# Patient Record
Sex: Female | Born: 1945 | Race: White | Hispanic: Yes | Smoking: Never smoker
Health system: Southern US, Community
[De-identification: ages and names within clinical notes are randomized; demographics above are authoritative.]

## PROBLEM LIST (undated history)

## (undated) DIAGNOSIS — G4730 Sleep apnea, unspecified: Secondary | ICD-10-CM

## (undated) DIAGNOSIS — K589 Irritable bowel syndrome without diarrhea: Secondary | ICD-10-CM

## (undated) DIAGNOSIS — N189 Chronic kidney disease, unspecified: Secondary | ICD-10-CM

## (undated) DIAGNOSIS — I10 Essential (primary) hypertension: Secondary | ICD-10-CM

## (undated) DIAGNOSIS — F419 Anxiety disorder, unspecified: Secondary | ICD-10-CM

## (undated) DIAGNOSIS — M1990 Unspecified osteoarthritis, unspecified site: Secondary | ICD-10-CM

## (undated) DIAGNOSIS — K219 Gastro-esophageal reflux disease without esophagitis: Secondary | ICD-10-CM

## (undated) DIAGNOSIS — E785 Hyperlipidemia, unspecified: Secondary | ICD-10-CM

## (undated) DIAGNOSIS — D649 Anemia, unspecified: Secondary | ICD-10-CM

## (undated) DIAGNOSIS — H269 Unspecified cataract: Secondary | ICD-10-CM

## (undated) DIAGNOSIS — K5289 Other specified noninfective gastroenteritis and colitis: Secondary | ICD-10-CM

## (undated) DIAGNOSIS — F32A Depression, unspecified: Secondary | ICD-10-CM

## (undated) DIAGNOSIS — F329 Major depressive disorder, single episode, unspecified: Secondary | ICD-10-CM

## (undated) HISTORY — DX: Essential (primary) hypertension: I10

## (undated) HISTORY — DX: Irritable bowel syndrome, unspecified: K58.9

## (undated) HISTORY — DX: Gastro-esophageal reflux disease without esophagitis: K21.9

## (undated) HISTORY — DX: Chronic kidney disease, unspecified: N18.9

## (undated) HISTORY — DX: Sleep apnea, unspecified: G47.30

## (undated) HISTORY — DX: Anemia, unspecified: D64.9

## (undated) HISTORY — DX: Depression, unspecified: F32.A

## (undated) HISTORY — DX: Unspecified cataract: H26.9

## (undated) HISTORY — PX: FACIAL COSMETIC SURGERY: SHX629

## (undated) HISTORY — DX: Other specified noninfective gastroenteritis and colitis: K52.89

## (undated) HISTORY — DX: Hyperlipidemia, unspecified: E78.5

## (undated) HISTORY — DX: Anxiety disorder, unspecified: F41.9

---
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
## 2000-08-27 DIAGNOSIS — N189 Chronic kidney disease, unspecified: Secondary | ICD-10-CM

## 2000-08-27 HISTORY — DX: Chronic kidney disease, unspecified: N18.9

## 2008-03-19 ENCOUNTER — Ambulatory Visit: Payer: Self-pay | Admitting: Internal Medicine

## 2008-03-19 DIAGNOSIS — E1165 Type 2 diabetes mellitus with hyperglycemia: Secondary | ICD-10-CM | POA: Insufficient documentation

## 2008-03-19 DIAGNOSIS — M129 Arthropathy, unspecified: Secondary | ICD-10-CM | POA: Insufficient documentation

## 2008-03-19 DIAGNOSIS — R198 Other specified symptoms and signs involving the digestive system and abdomen: Secondary | ICD-10-CM | POA: Insufficient documentation

## 2008-03-19 DIAGNOSIS — D649 Anemia, unspecified: Secondary | ICD-10-CM

## 2008-03-19 DIAGNOSIS — M810 Age-related osteoporosis without current pathological fracture: Secondary | ICD-10-CM | POA: Insufficient documentation

## 2008-03-19 DIAGNOSIS — K5900 Constipation, unspecified: Secondary | ICD-10-CM | POA: Insufficient documentation

## 2008-03-19 DIAGNOSIS — R109 Unspecified abdominal pain: Secondary | ICD-10-CM

## 2008-03-19 DIAGNOSIS — K5289 Other specified noninfective gastroenteritis and colitis: Secondary | ICD-10-CM

## 2008-03-19 HISTORY — DX: Other specified noninfective gastroenteritis and colitis: K52.89

## 2008-03-22 LAB — CONVERTED CEMR LAB
ALT: 30 units/L (ref 0–35)
Amylase: 171 units/L — ABNORMAL HIGH (ref 27–131)
BUN: 14 mg/dL (ref 6–23)
Basophils Relative: 0.6 % (ref 0.0–3.0)
CO2: 26 meq/L (ref 19–32)
Calcium: 8.7 mg/dL (ref 8.4–10.5)
Chloride: 103 meq/L (ref 96–112)
Creatinine, Ser: 1.4 mg/dL — ABNORMAL HIGH (ref 0.4–1.2)
GFR calc Af Amer: 49 mL/min
Glucose, Bld: 162 mg/dL — ABNORMAL HIGH (ref 70–99)
HCT: 31.3 % — ABNORMAL LOW (ref 36.0–46.0)
Hemoglobin: 10.6 g/dL — ABNORMAL LOW (ref 12.0–15.0)
Neutro Abs: 4 10*3/uL (ref 1.4–7.7)
Neutrophils Relative %: 57.3 % (ref 43.0–77.0)
RBC: 3.33 M/uL — ABNORMAL LOW (ref 3.87–5.11)
RDW: 12.9 % (ref 11.5–14.6)
Total Protein: 7.6 g/dL (ref 6.0–8.3)
WBC: 6.9 10*3/uL (ref 4.5–10.5)

## 2008-03-25 ENCOUNTER — Ambulatory Visit: Payer: Self-pay | Admitting: Internal Medicine

## 2008-03-25 LAB — CONVERTED CEMR LAB
Amylase: 134 units/L — ABNORMAL HIGH (ref 27–131)
Vitamin B-12: 124 pg/mL — ABNORMAL LOW (ref 211–911)

## 2008-03-31 ENCOUNTER — Ambulatory Visit: Payer: Self-pay | Admitting: Internal Medicine

## 2008-04-28 ENCOUNTER — Ambulatory Visit: Payer: Self-pay | Admitting: Internal Medicine

## 2008-05-05 ENCOUNTER — Ambulatory Visit: Payer: Self-pay | Admitting: Internal Medicine

## 2008-05-05 DIAGNOSIS — E538 Deficiency of other specified B group vitamins: Secondary | ICD-10-CM

## 2008-05-12 ENCOUNTER — Ambulatory Visit: Payer: Self-pay | Admitting: Internal Medicine

## 2008-06-25 ENCOUNTER — Ambulatory Visit (HOSPITAL_COMMUNITY): Admission: RE | Admit: 2008-06-25 | Discharge: 2008-06-25 | Payer: Self-pay | Admitting: Family Medicine

## 2009-11-15 ENCOUNTER — Observation Stay (HOSPITAL_COMMUNITY): Admission: EM | Admit: 2009-11-15 | Discharge: 2009-11-15 | Payer: Self-pay | Admitting: Emergency Medicine

## 2010-08-25 ENCOUNTER — Emergency Department (HOSPITAL_COMMUNITY)
Admission: EM | Admit: 2010-08-25 | Discharge: 2010-08-26 | Payer: Self-pay | Source: Home / Self Care | Admitting: Emergency Medicine

## 2010-11-06 LAB — URINE MICROSCOPIC-ADD ON

## 2010-11-06 LAB — POCT I-STAT, CHEM 8
Calcium, Ion: 1.18 mmol/L (ref 1.12–1.32)
Creatinine, Ser: 1.8 mg/dL — ABNORMAL HIGH (ref 0.4–1.2)
Hemoglobin: 10.9 g/dL — ABNORMAL LOW (ref 12.0–15.0)

## 2010-11-06 LAB — URINALYSIS, ROUTINE W REFLEX MICROSCOPIC: Glucose, UA: NEGATIVE mg/dL

## 2010-11-06 LAB — URINE CULTURE: Colony Count: >=100000

## 2010-11-06 LAB — GLUCOSE, CAPILLARY: Glucose-Capillary: 232 mg/dL — ABNORMAL HIGH (ref 70–99)

## 2010-11-20 LAB — URINALYSIS, ROUTINE W REFLEX MICROSCOPIC
Glucose, UA: 100 mg/dL — AB
Ketones, ur: NEGATIVE mg/dL
Nitrite: NEGATIVE

## 2010-11-20 LAB — GLUCOSE, CAPILLARY
Glucose-Capillary: 185 mg/dL — ABNORMAL HIGH (ref 70–99)
Glucose-Capillary: 237 mg/dL — ABNORMAL HIGH (ref 70–99)

## 2010-11-20 LAB — DIFFERENTIAL
Basophils Absolute: 0 10*3/uL (ref 0.0–0.1)
Eosinophils Absolute: 0.1 10*3/uL (ref 0.0–0.7)
Lymphocytes Relative: 45 % (ref 12–46)
Monocytes Absolute: 0.4 10*3/uL (ref 0.1–1.0)

## 2010-11-20 LAB — CBC
HCT: 32.8 % — ABNORMAL LOW (ref 36.0–46.0)
Hemoglobin: 11.3 g/dL — ABNORMAL LOW (ref 12.0–15.0)
MCV: 92.5 fL (ref 78.0–100.0)
WBC: 6.8 10*3/uL (ref 4.0–10.5)

## 2010-11-20 LAB — URINE MICROSCOPIC-ADD ON

## 2011-05-25 LAB — GLUCOSE, CAPILLARY
Glucose-Capillary: 144 — ABNORMAL HIGH
Glucose-Capillary: 149 — ABNORMAL HIGH

## 2011-08-28 DIAGNOSIS — E785 Hyperlipidemia, unspecified: Secondary | ICD-10-CM

## 2011-08-28 HISTORY — DX: Hyperlipidemia, unspecified: E78.5

## 2011-11-16 ENCOUNTER — Other Ambulatory Visit: Payer: Self-pay | Admitting: Obstetrics and Gynecology

## 2011-11-16 DIAGNOSIS — Z1231 Encounter for screening mammogram for malignant neoplasm of breast: Secondary | ICD-10-CM

## 2011-11-27 ENCOUNTER — Ambulatory Visit (HOSPITAL_COMMUNITY)
Admission: RE | Admit: 2011-11-27 | Discharge: 2011-11-27 | Disposition: A | Discharge status: Home / Self Care | Payer: Self-pay | Source: Ambulatory Visit | Attending: Obstetrics and Gynecology | Admitting: Obstetrics and Gynecology

## 2011-11-27 ENCOUNTER — Ambulatory Visit (INDEPENDENT_AMBULATORY_CARE_PROVIDER_SITE_OTHER): Payer: Self-pay | Admitting: *Deleted

## 2011-11-27 VITALS — BP 122/69 | HR 86 | Temp 97.4°F | Ht 60.25 in | Wt 126.2 lb

## 2011-11-27 DIAGNOSIS — Z1239 Encounter for other screening for malignant neoplasm of breast: Secondary | ICD-10-CM

## 2011-11-27 DIAGNOSIS — Z1231 Encounter for screening mammogram for malignant neoplasm of breast: Secondary | ICD-10-CM

## 2011-11-27 NOTE — Patient Instructions (Signed)
Taught patient how to perform BSE. Patient did not need a Pap smear today due to last Pap smear was 10/22/11. Let her know [facility] will cover Pap smears every 3 years unless has a history of abnormal Pap smears. Patient is escorted to mammography for a screening mammogram. Let patient know will follow up with her within the next couple weeks with results. Patient verbalized understanding.  

## 2011-11-27 NOTE — Progress Notes (Signed)
No complaints today.  Pap Smear:    Pap smear not performed today. Patients last Pap smear was 10/22/11 at the free Pap smear screening at the [facility] and awaiting result. Per patient she has no history of abnormal Pap smears. No Pap smear results in EPIC.  Physical exam: Breasts Breasts symmetrical. No skin abnormalities bilateral breasts. No nipple retraction bilateral breasts. No nipple discharge bilateral breasts. No lymphadenopathy. No lumps palpated bilateral breasts. No complaints of pain or tenderness on palpation.         Pelvic/Bimanual No Pap smear completed today since last Pap smear was 10/22/11. Pap smear not indicated per BCCCP guidelines.

## 2011-12-17 ENCOUNTER — Encounter: Payer: Self-pay | Admitting: Obstetrics and Gynecology

## 2013-09-04 ENCOUNTER — Ambulatory Visit: Payer: Self-pay | Attending: Internal Medicine

## 2013-09-21 ENCOUNTER — Ambulatory Visit: Payer: Self-pay

## 2013-10-09 ENCOUNTER — Encounter (HOSPITAL_BASED_OUTPATIENT_CLINIC_OR_DEPARTMENT_OTHER): Payer: Self-pay | Admitting: Emergency Medicine

## 2013-10-09 ENCOUNTER — Emergency Department (HOSPITAL_BASED_OUTPATIENT_CLINIC_OR_DEPARTMENT_OTHER)
Admission: EM | Admit: 2013-10-09 | Discharge: 2013-10-09 | Disposition: A | Discharge status: Home / Self Care | Payer: Medicaid Other | Attending: Emergency Medicine | Admitting: Emergency Medicine

## 2013-10-09 DIAGNOSIS — E119 Type 2 diabetes mellitus without complications: Secondary | ICD-10-CM | POA: Insufficient documentation

## 2013-10-09 DIAGNOSIS — N189 Chronic kidney disease, unspecified: Secondary | ICD-10-CM | POA: Insufficient documentation

## 2013-10-09 DIAGNOSIS — Z794 Long term (current) use of insulin: Secondary | ICD-10-CM | POA: Insufficient documentation

## 2013-10-09 DIAGNOSIS — L03319 Cellulitis of trunk, unspecified: Principal | ICD-10-CM

## 2013-10-09 DIAGNOSIS — L0390 Cellulitis, unspecified: Secondary | ICD-10-CM

## 2013-10-09 DIAGNOSIS — Z8719 Personal history of other diseases of the digestive system: Secondary | ICD-10-CM | POA: Insufficient documentation

## 2013-10-09 DIAGNOSIS — Z792 Long term (current) use of antibiotics: Secondary | ICD-10-CM | POA: Insufficient documentation

## 2013-10-09 DIAGNOSIS — Z79899 Other long term (current) drug therapy: Secondary | ICD-10-CM | POA: Insufficient documentation

## 2013-10-09 DIAGNOSIS — L0291 Cutaneous abscess, unspecified: Secondary | ICD-10-CM

## 2013-10-09 DIAGNOSIS — L02219 Cutaneous abscess of trunk, unspecified: Secondary | ICD-10-CM | POA: Insufficient documentation

## 2013-10-09 MED ORDER — CEPHALEXIN 500 MG PO CAPS: 500.0000 mg | ORAL_CAPSULE | Freq: Four times a day (QID) | ORAL | Status: DC

## 2013-10-09 NOTE — ED Notes (Signed)
I & D tray is at the bedside set up and ready for the doctor to use. 

## 2013-10-09 NOTE — Discharge Instructions (Signed)
Take Keflex as directed until gone. Refer to attached documents for more information.  °

## 2013-10-09 NOTE — ED Notes (Addendum)
Abscess to her right abdomen. Hx of diabetes. Family states her BS has been out of control for the past 2 weeks and her MD has been working on adjusting her Insulin.

## 2013-10-09 NOTE — ED Provider Notes (Signed)
CSN: [id number]     Arrival date & time 10/09/13  1458 History   First MD Initiated Contact with Patient 10/09/13 1553     Chief Complaint  Patient presents with  . Abscess     (Consider location/radiation/quality/duration/timing/severity/associated sxs/prior Treatment) Patient is a 68 y.o. female presenting with abscess. The history is provided by the patient. No language interpreter was used.  Abscess Location:  Torso Torso abscess location:  R flank Size:  3x3cm Abscess quality: fluctuance   Red streaking: no   Duration:  3 days Progression:  Worsening Chronicity:  New Context: diabetes   Context: not immunosuppression, not insect bite/sting and not skin injury   Relieved by:  Nothing Worsened by:  Nothing tried Ineffective treatments:  None tried Associated symptoms: no fatigue, no fever, no nausea and no vomiting   Risk factors: prior abscess   Risk factors: no family hx of MRSA and no hx of MRSA     Past Medical History  Diagnosis Date  . Diabetes mellitus   . IBS (irritable bowel syndrome)   . Chronic kidney disease   . Hyperlipidemia    History reviewed. No pertinent past surgical history. Family History  Problem Relation Age of Onset  . Diabetes Brother   . Diabetes Brother   . Diabetes Brother   . Diabetes Brother    History  Substance Use Topics  . Smoking status: Never Smoker   . Smokeless tobacco: Not on file  . Alcohol Use: No   OB History   Grav Para Term Preterm Abortions TAB SAB Ect Mult Living   7 7 7       7      Review of Systems  Constitutional: Negative for fever, chills and fatigue.  HENT: Negative for trouble swallowing.   Eyes: Negative for visual disturbance.  Respiratory: Negative for shortness of breath.   Cardiovascular: Negative for chest pain and palpitations.  Gastrointestinal: Negative for nausea, vomiting, abdominal pain and diarrhea.  Genitourinary: Negative for dysuria and difficulty urinating.  Musculoskeletal:  Negative for arthralgias and neck pain.  Skin: Positive for wound. Negative for color change.  Neurological: Negative for dizziness and weakness.  Psychiatric/Behavioral: Negative for dysphoric mood.      Allergies  Pollen extract-tree extract and Sulfonamide derivatives  Home Medications   Current Outpatient Rx  Name  Route  Sig  Dispense  Refill  . Insulin Lispro, Human, (HUMALOG [place])   Subcutaneous   Inject into the skin.         Marland Kitchen lisinopril (PRINIVIL,ZESTRIL) 10 MG tablet   Oral   Take 10 mg by mouth daily.         . nitrofurantoin (MACRODANTIN) 100 MG capsule   Oral   Take 100 mg by mouth daily.         . sertraline (ZOLOFT) 50 MG tablet   Oral   Take 50 mg by mouth daily.         . sitaGLIPtin (JANUVIA) 50 MG tablet   Oral   Take 50 mg by mouth daily.          BP 106/58  Pulse 95  Temp(Src) 98.2 F (36.8 C) (Oral)  Resp 20  Wt 126 lb (57.153 kg)  SpO2 97% Physical Exam  Nursing note and vitals reviewed. Constitutional: She is oriented to person, place, and time. She appears well-developed and well-nourished. No distress.  HENT:  Head: Normocephalic and atraumatic.  Eyes: Conjunctivae are normal.  Neck: Normal range of motion.  Cardiovascular: Normal rate and regular rhythm.  Exam reveals no gallop and no friction rub.   No murmur heard. Pulmonary/Chest: Effort normal and breath sounds normal. She has no wheezes. She has no rales. She exhibits no tenderness.  Abdominal: Soft. She exhibits no distension. There is no tenderness.  Musculoskeletal: Normal range of motion.  Neurological: She is alert and oriented to person, place, and time. Coordination normal.  Speech is goal-oriented. Moves limbs without ataxia.   Skin: Skin is warm and dry.  3x3cm area of induration of right flank with central fluctuance to palpation.   Psychiatric: She has a normal mood and affect. Her behavior is normal.    ED Course  Procedures (including critical care  time)  INCISION AND DRAINAGE Performed by: Alvina Chou Consent: Verbal consent obtained. Risks and benefits: risks, benefits and alternatives were discussed Type: abscess  Body area: right flank  Anesthesia: local infiltration  Incision was made with a scalpel.  Local anesthetic: lidocaine 2% without epinephrine  Anesthetic total: 1 ml  Complexity: complex Blunt dissection to break up loculations  Drainage: purulent  Drainage amount: 25mL  Patient tolerance: Patient tolerated the procedure well with no immediate complications.     Labs Review Labs Reviewed - No data to display Imaging Review No results found.  EKG Interpretation   None       MDM   Final diagnoses:  Abscess and cellulitis    4:15 PM Abscess drained without complication. Patient will be discharged with keflex. Vitals stable and patient afebrile. No further evaluation needed at this time.     Alvina Chou, PA-C 10/09/13 1635

## 2013-10-09 NOTE — ED Provider Notes (Signed)
Medical screening examination/treatment/procedure(s) were performed by non-physician practitioner and as supervising physician I was immediately available for consultation/collaboration.  Neta Ehlers, MD 10/09/13 [phone]

## 2013-11-24 DIAGNOSIS — E11319 Type 2 diabetes mellitus with unspecified diabetic retinopathy without macular edema: Secondary | ICD-10-CM | POA: Insufficient documentation

## 2014-01-22 ENCOUNTER — Other Ambulatory Visit: Payer: Self-pay | Admitting: Nephrology

## 2014-01-22 DIAGNOSIS — N183 Chronic kidney disease, stage 3 unspecified: Secondary | ICD-10-CM

## 2014-01-27 ENCOUNTER — Ambulatory Visit
Admission: RE | Admit: 2014-01-27 | Discharge: 2014-01-27 | Disposition: A | Discharge status: Home / Self Care | Payer: Medicaid Other | Source: Ambulatory Visit | Attending: Nephrology | Admitting: Nephrology

## 2014-01-27 DIAGNOSIS — N183 Chronic kidney disease, stage 3 unspecified: Secondary | ICD-10-CM

## 2014-06-28 ENCOUNTER — Encounter (HOSPITAL_BASED_OUTPATIENT_CLINIC_OR_DEPARTMENT_OTHER): Payer: Self-pay | Admitting: Emergency Medicine

## 2014-09-07 ENCOUNTER — Ambulatory Visit: Payer: Medicaid Other | Attending: Family Medicine | Admitting: Family Medicine

## 2014-09-07 ENCOUNTER — Encounter: Payer: Self-pay | Admitting: Family Medicine

## 2014-09-07 VITALS — BP 138/73 | HR 84 | Temp 97.9°F | Resp 16 | Ht 60.0 in | Wt 149.0 lb

## 2014-09-07 DIAGNOSIS — I10 Essential (primary) hypertension: Secondary | ICD-10-CM | POA: Diagnosis not present

## 2014-09-07 DIAGNOSIS — E079 Disorder of thyroid, unspecified: Secondary | ICD-10-CM

## 2014-09-07 DIAGNOSIS — IMO0002 Reserved for concepts with insufficient information to code with codable children: Secondary | ICD-10-CM

## 2014-09-07 DIAGNOSIS — E119 Type 2 diabetes mellitus without complications: Secondary | ICD-10-CM | POA: Diagnosis not present

## 2014-09-07 DIAGNOSIS — F32A Depression, unspecified: Secondary | ICD-10-CM | POA: Insufficient documentation

## 2014-09-07 DIAGNOSIS — D649 Anemia, unspecified: Secondary | ICD-10-CM

## 2014-09-07 DIAGNOSIS — E1165 Type 2 diabetes mellitus with hyperglycemia: Secondary | ICD-10-CM

## 2014-09-07 DIAGNOSIS — F329 Major depressive disorder, single episode, unspecified: Secondary | ICD-10-CM

## 2014-09-07 LAB — POCT GLYCOSYLATED HEMOGLOBIN (HGB A1C): Hemoglobin A1C: 10.4

## 2014-09-07 LAB — CBC
HEMATOCRIT: 32 % — AB (ref 36.0–46.0)
Hemoglobin: 10.8 g/dL — ABNORMAL LOW (ref 12.0–15.0)
MCH: 30.7 pg (ref 26.0–34.0)
MCHC: 33.8 g/dL (ref 30.0–36.0)
MCV: 90.9 fL (ref 78.0–100.0)
MPV: 10 fL (ref 8.6–12.4)
Platelets: 230 10*3/uL (ref 150–400)
RBC: 3.52 MIL/uL — ABNORMAL LOW (ref 3.87–5.11)
RDW: 14.5 % (ref 11.5–15.5)
WBC: 7.1 10*3/uL (ref 4.0–10.5)

## 2014-09-07 LAB — GLUCOSE, POCT (MANUAL RESULT ENTRY): POC Glucose: 273 mg/dl — AB (ref 70–99)

## 2014-09-07 MED ORDER — GABAPENTIN 300 MG PO CAPS: 300.0000 mg | ORAL_CAPSULE | Freq: Every day | ORAL | Status: DC

## 2014-09-07 MED ORDER — INSULIN ASPART 100 UNIT/ML FLEXPEN: 15.0000 [IU] | PEN_INJECTOR | Freq: Three times a day (TID) | SUBCUTANEOUS | Status: DC

## 2014-09-07 MED ORDER — INSULIN GLARGINE 100 UNIT/ML ~~LOC~~ SOLN: 50.0000 [IU] | Freq: Every day | SUBCUTANEOUS | Status: DC

## 2014-09-07 NOTE — Patient Instructions (Addendum)
Brenda Andrews,  Thank you for coming in today. It was a pleasure meeting you. I look forward to being your primary doctor.   1. Diabetes type 2: Increase lantus to 50 U at night Novolog 15 U three times daily  Continue regular meals. If sugar is low < 70 do not take novolog until it is over 100 Gabapentin for diabetic nerve pain Referral to ophthalmology   2. Thyroid issue: checking thyroid function   You will be called with lab results.   F/u in  3 weeks for diabetes f/u and depression  Dr. Adrian Blackwater

## 2014-09-07 NOTE — Progress Notes (Signed)
   Subjective:    Patient ID: Brenda Andrews, female    DOB: Oct 24, 1945, 69 y.o.   MRN: [id number] CC: establish care, chronic diabetes, HTN HPI 69 yo Hispanic female presents to establish care and discuss the following: History obtained via [place] interpreter   1. CHRONIC DIABETES  Disease Monitoring  Blood Sugar Ranges: 175-600   Polyuria: yes w/o dysuria   Visual problems: yes has hx of laser cataract surgery in both eyes about one year ago.   Medication Compliance: yes with lantus 30 U at night, novolog 30 U in AM and 20 U in PM and lisinopril 10 mg daily for prevention of diabetic nephropathy.  Medication Side Effects  Hypoglycemia: yes, reports sugars down to 40 and 50. She is symptomatic at this time and feels hungry.    Preventitive Health Care  Eye Exam: due   Foot Exam: done today   Diet pattern:  2. Thyroid issue: patient reports that she was told she had a problem with her thyroid last summer. She cannot recall what the problem was. There was no treatment done. She denies fever and chills. She admits to swelling with pink-purple skin discoloration in her hand in the morning.   Med Hx: DM dx in 1993 Surg Hx: catarct surgery in 2015 Soc Hx: non smoker  Review of Systems As per HPI     Objective:   Physical Exam BP 138/73 mmHg  Pulse 84  Temp(Src) 97.9 F (36.6 C) (Oral)  Resp 16  Ht 5' (1.524 m)  Wt 149 lb (67.586 kg)  BMI 29.10 kg/m2  SpO2 98% General appearance: alert, cooperative and no distress Neck: no adenopathy, no carotid bruit, no JVD, supple, symmetrical, trachea midline and thyroid not enlarged, symmetric, no tenderness/mass/nodules Lungs: clear to auscultation bilaterally Heart: regular rate and rhythm, S1, S2 normal, no murmur, click, rub or gallop Extremities:no edema, darkened finger nails, skin warm and dry Pulses" 2+ b/l radial and DP  Diabetic foot exam done today   Lab Results  Component Value Date   HGBA1C 10.40  09/07/2014        Assessment & Plan:

## 2014-09-07 NOTE — Progress Notes (Signed)
Pt is here to establish care. Pt reports having chronic pain in her upper back. Pt said that she is having a headache today. She also has a burning pain in both feet. Pt has a history of kidney disease, hyperlipidemia, HTN and diabetes. Pt states that when she wakes up every day her hands are discolored pink and purple. Also her nails are discolored black in some nails.

## 2014-09-07 NOTE — Assessment & Plan Note (Addendum)
Thyroid issue: checking thyroid function Normal thyroid studies Will resolve problem

## 2014-09-07 NOTE — Assessment & Plan Note (Addendum)
A: Diabetes type 2: uncontrolled with labile CBGs, hyperglycemia today and diabetic neuropathy P: Increase lantus to 50 U at night Novolog 15 U three times daily  Continue regular meals. If sugar is low < 70 do not take novolog until it is over 100 Gabapentin for diabetic nerve pain Referral to ophthalmology  Lipids, urine microalbumin, CMP, CBC today    Normal urine micro albumin Stable renal function.  High TGs and low HDL on non-fasting lipids.  Plan: restart lisinopril 10 mg daily with plan to increase dose over time as tolerated. Start lipitor 40 mg daily.  F/u fasting lipids in 5-6 weeks, patient to schedule blood draw.

## 2014-09-08 LAB — MICROALBUMIN / CREATININE URINE RATIO
CREATININE, URINE: 25.7 mg/dL
Microalb Creat Ratio: 27.2 mg/g (ref 0.0–30.0)
Microalb, Ur: 0.7 mg/dL (ref <2.0)

## 2014-09-08 LAB — COMPLETE METABOLIC PANEL WITH GFR
ALBUMIN: 4 g/dL (ref 3.5–5.2)
ALT: 16 U/L (ref 0–35)
AST: 15 U/L (ref 0–37)
Alkaline Phosphatase: 92 U/L (ref 39–117)
BUN: 34 mg/dL — AB (ref 6–23)
CHLORIDE: 102 meq/L (ref 96–112)
CO2: 26 mEq/L (ref 19–32)
Calcium: 8.6 mg/dL (ref 8.4–10.5)
Creat: 1.18 mg/dL — ABNORMAL HIGH (ref 0.50–1.10)
GFR, EST AFRICAN AMERICAN: 54 mL/min — AB
GFR, Est Non African American: 47 mL/min — ABNORMAL LOW
Glucose, Bld: 255 mg/dL — ABNORMAL HIGH (ref 70–99)
POTASSIUM: 5.5 meq/L — AB (ref 3.5–5.3)
Sodium: 133 mEq/L — ABNORMAL LOW (ref 135–145)
TOTAL PROTEIN: 7.3 g/dL (ref 6.0–8.3)
Total Bilirubin: 0.3 mg/dL (ref 0.2–1.2)

## 2014-09-08 LAB — LIPID PANEL
CHOLESTEROL: 182 mg/dL (ref 0–200)
HDL: 34 mg/dL — AB (ref >39)
TRIGLYCERIDES: 406 mg/dL — AB (ref <150)
Total CHOL/HDL Ratio: 5.4 Ratio

## 2014-09-08 LAB — T3: T3 TOTAL: 79.7 ng/dL — AB (ref 80.0–204.0)

## 2014-09-08 LAB — TSH: TSH: 4.043 u[IU]/mL (ref 0.350–4.500)

## 2014-09-08 LAB — T4, FREE: FREE T4: 0.89 ng/dL (ref 0.80–1.80)

## 2014-09-09 MED ORDER — ATORVASTATIN CALCIUM 40 MG PO TABS: 40.0000 mg | ORAL_TABLET | Freq: Every day | ORAL | Status: DC

## 2014-09-09 MED ORDER — LISINOPRIL 10 MG PO TABS: 10.0000 mg | ORAL_TABLET | Freq: Every day | ORAL | Status: DC

## 2014-09-09 NOTE — Addendum Note (Signed)
Addended by: Boykin Nearing on: 09/09/2014 06:27 PM   Modules accepted: Orders

## 2014-09-09 NOTE — Assessment & Plan Note (Signed)
A; persistent normocytic anemia P: FOBT Referral to GI for colonoscopy Anemia panel at next visit with trial of oral iron

## 2014-09-10 ENCOUNTER — Telehealth: Payer: Self-pay | Admitting: *Deleted

## 2014-09-10 NOTE — Telephone Encounter (Signed)
-----   Message from Minerva Ends, MD sent at 09/09/2014  6:25 PM EST ----- Normal thyroid studies Normal urine micro albumin Stable renal function.  High TGs and low HDL on non-fasting lipids.  Plan: restart lisinopril 10 mg daily with plan to increase dose over time as tolerated. Start lipitor 40 mg daily.  F/u fasting lipids in 5-6 weeks, patient to schedule blood draw.

## 2014-09-20 ENCOUNTER — Telehealth: Payer: Self-pay | Admitting: Family Medicine

## 2014-09-20 ENCOUNTER — Other Ambulatory Visit: Payer: Self-pay | Admitting: *Deleted

## 2014-09-20 DIAGNOSIS — IMO0002 Reserved for concepts with insufficient information to code with codable children: Secondary | ICD-10-CM

## 2014-09-20 DIAGNOSIS — E1165 Type 2 diabetes mellitus with hyperglycemia: Secondary | ICD-10-CM

## 2014-09-20 MED ORDER — LISINOPRIL 10 MG PO TABS: 10.0000 mg | ORAL_TABLET | Freq: Every day | ORAL | Status: DC

## 2014-09-20 MED ORDER — ATORVASTATIN CALCIUM 40 MG PO TABS: 40.0000 mg | ORAL_TABLET | Freq: Every day | ORAL | Status: DC

## 2014-09-20 NOTE — Telephone Encounter (Signed)
Patient called back to request blood work results.

## 2014-09-20 NOTE — Telephone Encounter (Signed)
Pt was given lab results. Ask for Rx to be change to [facility], Rx change and send  (informatio was given in [place])

## 2014-10-04 ENCOUNTER — Telehealth: Payer: Self-pay | Admitting: Family Medicine

## 2014-10-04 NOTE — Telephone Encounter (Signed)
Patients family member called stating that her mother is not feeling well and would like to see her PCP. Please f/u with pt.

## 2014-10-05 NOTE — Telephone Encounter (Signed)
Left voice message to return call 

## 2014-10-15 ENCOUNTER — Encounter: Payer: Self-pay | Admitting: Family Medicine

## 2014-10-15 ENCOUNTER — Ambulatory Visit: Payer: Medicaid Other | Attending: Family Medicine | Admitting: Family Medicine

## 2014-10-15 VITALS — BP 108/61 | HR 86 | Temp 98.1°F | Resp 16 | Ht 60.0 in | Wt 148.0 lb

## 2014-10-15 DIAGNOSIS — M79641 Pain in right hand: Secondary | ICD-10-CM | POA: Insufficient documentation

## 2014-10-15 DIAGNOSIS — E559 Vitamin D deficiency, unspecified: Secondary | ICD-10-CM

## 2014-10-15 DIAGNOSIS — R200 Anesthesia of skin: Secondary | ICD-10-CM | POA: Diagnosis not present

## 2014-10-15 DIAGNOSIS — E119 Type 2 diabetes mellitus without complications: Secondary | ICD-10-CM

## 2014-10-15 DIAGNOSIS — R51 Headache: Secondary | ICD-10-CM | POA: Insufficient documentation

## 2014-10-15 DIAGNOSIS — Z23 Encounter for immunization: Secondary | ICD-10-CM | POA: Diagnosis not present

## 2014-10-15 DIAGNOSIS — E1165 Type 2 diabetes mellitus with hyperglycemia: Secondary | ICD-10-CM

## 2014-10-15 DIAGNOSIS — R208 Other disturbances of skin sensation: Secondary | ICD-10-CM

## 2014-10-15 DIAGNOSIS — E538 Deficiency of other specified B group vitamins: Secondary | ICD-10-CM

## 2014-10-15 DIAGNOSIS — R42 Dizziness and giddiness: Secondary | ICD-10-CM | POA: Insufficient documentation

## 2014-10-15 DIAGNOSIS — IMO0002 Reserved for concepts with insufficient information to code with codable children: Secondary | ICD-10-CM

## 2014-10-15 LAB — GLUCOSE, POCT (MANUAL RESULT ENTRY): POC GLUCOSE: 109 mg/dL — AB (ref 70–99)

## 2014-10-15 MED ORDER — LISINOPRIL 5 MG PO TABS: 5.0000 mg | ORAL_TABLET | Freq: Every day | ORAL | Status: DC

## 2014-10-15 MED ORDER — IBUPROFEN 600 MG PO TABS: 600.0000 mg | ORAL_TABLET | Freq: Three times a day (TID) | ORAL | Status: DC | PRN

## 2014-10-15 MED ORDER — GABAPENTIN 300 MG PO CAPS: 300.0000 mg | ORAL_CAPSULE | Freq: Every day | ORAL | Status: DC

## 2014-10-15 NOTE — Progress Notes (Signed)
Patient here to follow up on her DM2 Patient has not been checking her sugars at home because she complains of pain in fingers and toes Patient will take flu shot

## 2014-10-15 NOTE — Patient Instructions (Signed)
Senora Dominguez-Hernandez.  Thank you for coming in today  1. Dizziness and headaches when leaning forward: taking gabapentin during the day.  Start gabapentin during the evening 300-600 mg Cut lisinopril in half, 5 mg daily instead of 10 mg  Checking vit D level  2. Finger numbness: Checking vitamin B12 level Continue to monitor blood sugars  3. R hand pain at 2nd and 5th fingers Ibuprofen 600 mg three times daily with food for 5 days, then 1-2 times daily as needed after that.  Go for R hand x-ray   Diabetes  Check blood sugar day fasting (3 times per day) and before meals  Goal fasting 100  Goal after eating < 160 Beware of hypoglycemia (low blood sugar) which is blood sugar < 70 with or without symptoms  F/u in 4 weeks for hand pain  Dr. Adrian Blackwater

## 2014-10-15 NOTE — Assessment & Plan Note (Signed)
A: Dizziness and headaches when leaning forward: taking gabapentin during the day.  P: Start gabapentin during the evening 300-600 mg Cut lisinopril in half, 5 mg daily instead of 10 mg  Checking vit D level

## 2014-10-15 NOTE — Assessment & Plan Note (Signed)
A:  R hand pain at 2nd and 5th fingers, suspect tendonitis P: Ibuprofen 600 mg three times daily with food for 5 days, then 1-2 times daily as needed after that.  Go for R hand x-ray

## 2014-10-15 NOTE — Progress Notes (Signed)
   Subjective:    Patient ID: Brenda Andrews, female    DOB: 06/09/1946, 69 y.o.   MRN: [id number] CC: dizziness, headache, numbness in digits, R hand pain  HPI 69 yo Hispanic female with hx of DM2:  1. Dizziness and headaches: when patient bends over and stands up x 3 months. No falling. No ringing in ears. She is taking gabapentin during the day instead of at night. She is taking lisinopril 10 mg daily to prevent diabetic nephropathy.  2. Numbness: in finger and toes of both hands. Day and night. Taking gabapentin 300 mg during the day. Has hx of vit B12 deficiency.   3. R hand pain: 2st and 5th MTP, palmar side. Mostly in 2nd MTP.  Worse with finger g flexion. No swelling, redness or deformity. No home treatments tried.   Soc Hx: non smoker  Review of Systems As per HPI      Objective:   Physical Exam BP 108/61 mmHg  Pulse 86  Temp(Src) 98.1 F (36.7 C)  Resp 16  Ht 5' (1.524 m)  Wt 148 lb (67.132 kg)  BMI 28.90 kg/m2  SpO2 99% General appearance: alert, cooperative and no distress Head: Normocephalic, without obvious abnormality, atraumatic Eyes: conjunctivae/corneas clear. PERRL, EOM's intact.  Ears: normal TM's and external ear canals both ears Lungs: normal WOB Extremities: normal hands w/o deformity, limited flexion and mild TTP R 1st digit, 2 + radial pulses   Lab Results  Component Value Date   HGBA1C 10.40 09/07/2014   CBG 109      Assessment & Plan:

## 2014-10-15 NOTE — Assessment & Plan Note (Signed)
Finger numbness: Checking vitamin B12 level Continue to monitor blood sugars

## 2014-10-16 LAB — VITAMIN B12: Vitamin B-12: 658 pg/mL (ref 211–911)

## 2014-10-16 LAB — VITAMIN D 25 HYDROXY (VIT D DEFICIENCY, FRACTURES): Vit D, 25-Hydroxy: 18 ng/mL — ABNORMAL LOW (ref 30–100)

## 2014-10-17 DIAGNOSIS — E559 Vitamin D deficiency, unspecified: Secondary | ICD-10-CM | POA: Insufficient documentation

## 2014-10-17 MED ORDER — VITAMIN D (ERGOCALCIFEROL) 1.25 MG (50000 UNIT) PO CAPS: 50000.0000 [IU] | ORAL_CAPSULE | ORAL | Status: DC

## 2014-10-17 NOTE — Addendum Note (Signed)
Addended by: Boykin Nearing on: 10/17/2014 08:20 AM   Modules accepted: Orders

## 2014-10-17 NOTE — Assessment & Plan Note (Signed)
A: likely cause of dizziness P: Supplement with 50K U weekly x 8 weeks

## 2014-10-17 NOTE — Assessment & Plan Note (Signed)
A: Diabetes:CBG well controlled Med: complaint  P: Check blood sugar day fasting (3 times per day) and before meals  Goal fasting 100  Goal after eating < 160 Beware of hypoglycemia (low blood sugar) which is blood sugar < 70 with or without symptoms

## 2014-10-17 NOTE — Assessment & Plan Note (Signed)
Resolved. Normal B12

## 2014-11-03 ENCOUNTER — Telehealth: Payer: Self-pay | Admitting: Family Medicine

## 2014-11-03 ENCOUNTER — Ambulatory Visit (INDEPENDENT_AMBULATORY_CARE_PROVIDER_SITE_OTHER): Payer: Self-pay | Admitting: Family Medicine

## 2014-11-03 VITALS — BP 132/50 | HR 88 | Temp 97.2°F | Resp 20 | Ht 61.0 in | Wt 146.6 lb

## 2014-11-03 DIAGNOSIS — D649 Anemia, unspecified: Secondary | ICD-10-CM

## 2014-11-03 DIAGNOSIS — R110 Nausea: Secondary | ICD-10-CM

## 2014-11-03 DIAGNOSIS — R269 Unspecified abnormalities of gait and mobility: Secondary | ICD-10-CM

## 2014-11-03 DIAGNOSIS — R109 Unspecified abdominal pain: Secondary | ICD-10-CM

## 2014-11-03 DIAGNOSIS — R42 Dizziness and giddiness: Secondary | ICD-10-CM

## 2014-11-03 LAB — POCT URINALYSIS DIPSTICK
BILIRUBIN UA: NEGATIVE
Glucose, UA: 500
KETONES UA: NEGATIVE
Leukocytes, UA: NEGATIVE
Nitrite, UA: NEGATIVE
Protein, UA: NEGATIVE
RBC UA: NEGATIVE
SPEC GRAV UA: 1.01
Urobilinogen, UA: 0.2
pH, UA: 6.5

## 2014-11-03 LAB — POCT UA - MICROSCOPIC ONLY
Bacteria, U Microscopic: NEGATIVE
Casts, Ur, LPF, POC: NEGATIVE
Crystals, Ur, HPF, POC: NEGATIVE
Mucus, UA: NEGATIVE
WBC, UR, HPF, POC: NEGATIVE
Yeast, UA: NEGATIVE

## 2014-11-03 LAB — POCT CBC
Granulocyte percent: 51.3 %G (ref 37–80)
HCT, POC: 34.7 % — AB (ref 37.7–47.9)
Hemoglobin: 11.3 g/dL — AB (ref 12.2–16.2)
Lymph, poc: 3.3 (ref 0.6–3.4)
MCH: 30.1 pg (ref 27–31.2)
MCHC: 32.6 g/dL (ref 31.8–35.4)
MCV: 92.4 fL (ref 80–97)
MID (cbc): 0.5 (ref 0–0.9)
MPV: 7.5 fL (ref 0–99.8)
PLATELET COUNT, POC: 256 10*3/uL (ref 142–424)
POC GRANULOCYTE: 4 (ref 2–6.9)
POC LYMPH %: 42.6 % (ref 10–50)
POC MID %: 6.1 %M (ref 0–12)
RBC: 3.76 M/uL — AB (ref 4.04–5.48)
RDW, POC: 13.9 %
WBC: 7.7 10*3/uL (ref 4.6–10.2)

## 2014-11-03 LAB — GLUCOSE, POCT (MANUAL RESULT ENTRY): POC Glucose: 370 mg/dl — AB (ref 70–99)

## 2014-11-03 MED ORDER — DIAZEPAM 2 MG PO TABS: ORAL_TABLET | ORAL | Status: DC

## 2014-11-03 NOTE — Patient Instructions (Addendum)
Take the diazepam one pill every 8 hours only when needed for bad dizziness.  Make certain you take a diabetic medicine faithfully  Drink plenty of water. If you do not it tends to cause her to be more dizzy  We will schedule you for a brain scan. Someone will call you regarding when and where that will be.  If you get abruptly worse go to the emergency room  I am not certain why she has had some pain in her left flank and abdomen. If that gets worse she is to return.  Take iron 1 pill daily for 2 months  Follow-up with your regular doctor for your diabetes in the near future.

## 2014-11-03 NOTE — Progress Notes (Signed)
Subjective: 69 year old lady who is brought in by her son. She does not speak [place], but he does very well. She has been having dizziness for the past 3 days. She had onset of it when she got up 3 days ago. It is persisted, maybe gotten worse. She's been staggering, more to the left. She has had clear speech. She has some headache but nothing major. She has not had any major change in her vision, though she has diabetic retinopathy and vision problems. She has not had any chest pains. She has not been short of breath. She has felt like she is maybe a little incoordinated on the left. She has some nausea but no vomiting. She lives with some friends and has a roommate.  She is diabetic and has been getting better control of her sugars. Last night it was 119. Her last A1c was high back in January in reviewing her old chart, but apparently she's been having better control since then. She has vision problems as noted above. She's never had spells of dizziness like this before. She [place] 6 years.  She does have some pain in the left side of abdomen and left flank.  Objective: TMs normal. Eyes the right pupil is 2 mm, left 1. Red reflex bilaterally. EOMs intact. Throat clear. Neck supple without nodes. No carotid bruits. Chest is clear to auscultation. Heart regular without murmurs gallops or arrhythmias. Extremities are grossly normal. Finger to nose normal. Romberg is negative. When she walks across the room and back she does stagger a little bit to the left. Grip seems symmetrical. Mild left CVA tenderness   Assessment: Dizziness Diabetes Left flank pain/left abdominal pain  Plan: CBC, glucose  Results for orders placed or performed in visit on 11/03/14  POCT CBC  Result Value Ref Range   WBC 7.7 4.6 - 10.2 K/uL   Lymph, poc 3.3 0.6 - 3.4   POC LYMPH PERCENT 42.6 10 - 50 %L   MID (cbc) 0.5 0 - 0.9   POC MID % 6.1 0 - 12 %M   POC Granulocyte 4.0 2 - 6.9   Granulocyte percent 51.3 37 - 80 %G   RBC 3.76 (A) 4.04 - 5.48 M/uL   Hemoglobin 11.3 (A) 12.2 - 16.2 g/dL   HCT, POC 34.7 (A) 37.7 - 47.9 %   MCV 92.4 80 - 97 fL   MCH, POC 30.1 27 - 31.2 pg   MCHC 32.6 31.8 - 35.4 g/dL   RDW, POC 13.9 %   Platelet Count, POC 256 142 - 424 K/uL   MPV 7.5 0 - 99.8 fL  POCT glucose (manual entry)  Result Value Ref Range   POC Glucose 370 (A) 70 - 99 mg/dl  POCT UA - Microscopic Only  Result Value Ref Range   WBC, Ur, HPF, POC neg    RBC, urine, microscopic 0-1    Bacteria, U Microscopic neg    Mucus, UA neg    Epithelial cells, urine per micros 0-2    Crystals, Ur, HPF, POC neg    Casts, Ur, LPF, POC neg    Yeast, UA neg   POCT urinalysis dipstick  Result Value Ref Range   Color, UA lt yellow    Clarity, UA clear    Glucose, UA 500    Bilirubin, UA neg    Ketones, UA neg    Spec Grav, UA 1.010    Blood, UA neg    pH, UA 6.5    Protein,  UA neg    Urobilinogen, UA 0.2    Nitrite, UA neg    Leukocytes, UA Negative

## 2014-11-03 NOTE — Telephone Encounter (Signed)
Patients family member called to request an appointment with her PCP, patient stated that the patient has been feeling dizzy since yesterday, family member was offered to walk-in to see our NP but she refused stating that she will find another provider, please f/u with pt. If necessary.

## 2014-11-13 ENCOUNTER — Ambulatory Visit
Admission: RE | Admit: 2014-11-13 | Discharge: 2014-11-13 | Disposition: A | Discharge status: Home / Self Care | Payer: Medicaid Other | Source: Ambulatory Visit | Attending: Family Medicine | Admitting: Family Medicine

## 2014-11-13 DIAGNOSIS — R269 Unspecified abnormalities of gait and mobility: Secondary | ICD-10-CM

## 2014-11-13 DIAGNOSIS — R42 Dizziness and giddiness: Secondary | ICD-10-CM

## 2014-12-14 ENCOUNTER — Ambulatory Visit: Payer: Medicaid Other | Attending: Family Medicine | Admitting: Family Medicine

## 2014-12-14 ENCOUNTER — Encounter: Payer: Self-pay | Admitting: Family Medicine

## 2014-12-14 VITALS — BP 128/68 | HR 86 | Temp 98.5°F | Resp 16 | Ht 61.0 in | Wt 141.0 lb

## 2014-12-14 DIAGNOSIS — E559 Vitamin D deficiency, unspecified: Secondary | ICD-10-CM

## 2014-12-14 DIAGNOSIS — B351 Tinea unguium: Secondary | ICD-10-CM | POA: Insufficient documentation

## 2014-12-14 DIAGNOSIS — IMO0002 Reserved for concepts with insufficient information to code with codable children: Secondary | ICD-10-CM

## 2014-12-14 DIAGNOSIS — L089 Local infection of the skin and subcutaneous tissue, unspecified: Secondary | ICD-10-CM | POA: Diagnosis not present

## 2014-12-14 DIAGNOSIS — E1165 Type 2 diabetes mellitus with hyperglycemia: Secondary | ICD-10-CM

## 2014-12-14 DIAGNOSIS — R21 Rash and other nonspecific skin eruption: Secondary | ICD-10-CM | POA: Insufficient documentation

## 2014-12-14 LAB — POCT GLYCOSYLATED HEMOGLOBIN (HGB A1C): HEMOGLOBIN A1C: 10.3

## 2014-12-14 LAB — GLUCOSE, POCT (MANUAL RESULT ENTRY): POC GLUCOSE: 206 mg/dL — AB (ref 70–99)

## 2014-12-14 MED ORDER — CLINDAMYCIN HCL 300 MG PO CAPS: 300.0000 mg | ORAL_CAPSULE | Freq: Four times a day (QID) | ORAL | Status: DC

## 2014-12-14 MED ORDER — KETOCONAZOLE 2 % EX CREA: 1.0000 "application " | TOPICAL_CREAM | Freq: Every day | CUTANEOUS | Status: DC

## 2014-12-14 NOTE — Patient Instructions (Addendum)
Brenda Andrews,  Thank you for coming in today.  1. Vit D deficiency- vit D level checked today.  2. Fungus in R thumb nail- another 4 weeks of lamisil after you finish 10 days of clindamycin.  3. Skin rash and fever: CBC with diff and CMP today.  Clindamycin for suspected bacterial rash Follow this up with antifungal cream- ketoconazole once daily.  F/u in 1 week for skin rash   Dr. Adrian Blackwater

## 2014-12-14 NOTE — Progress Notes (Signed)
F/U DM  Complaining of rash under breast x2 years, pain and itchy  Stated Hx cyst

## 2014-12-14 NOTE — Assessment & Plan Note (Signed)
A:  Skin rash and subjective fever. No fever today. Rash appears both fungal and bacterial. Patient at risk due to DM2 P: Clindamycin for suspected bacterial rash Follow this up with antifungal cream- ketoconazole once daily.

## 2014-12-14 NOTE — Assessment & Plan Note (Signed)
A: completed 8 weeks course of vit D P: recheck level at f/u

## 2014-12-14 NOTE — Progress Notes (Signed)
   Subjective:    Patient ID: Brenda Andrews, female    DOB: [birth year], 69 y.o.   MRN: [id number] CC: skin rash x 2 years, DM2 f/u  HPI [place] interpreter present   1. Skin rash: x 2 years. Under both breast, flanks and gluteal area. Has hx of ID under L breast. Rash is itchy and painful. Pain worsened 3 days ago. Patient has subjective fever. No drainage. Mild swelling under L breast. Treating with rubbing alcohol. No antipyretics taken today.   2. R thumb nail fungus: s/p 8 weeks of lamisil with slight improvement in nail. No improvement in skin rash.  3. Vit D def: took oral vit D, 50,000 IU x 8 weeks.   Soc hx: non smoker  Review of Systems  Constitutional: Positive for fever. Negative for chills.       Fever 3 days ago   Skin: Positive for color change and rash.      Objective:   Physical Exam BP 128/68 mmHg  Pulse 86  Temp(Src) 98.5 F (36.9 C) (Oral)  Resp 16  Ht 5\' 1"  (1.549 m)  Wt 141 lb (63.957 kg)  BMI 26.66 kg/m2  SpO2 96% General appearance: alert, cooperative and no distress Back: symmetric, no curvature. ROM normal. No CVA tenderness. Lungs: clear to auscultation bilaterally Breasts: normal appearance, no masses or tenderness Heart: regular rate and rhythm, S1, S2 normal, no murmur, click, rub or gallop Skin: hyperpigmented macular rash under both breast laterally and b/l flanks. Also with hyperpigmented papule on gluteal areas b/l. Erythematous nodule under L breast.  Nails: hyperpigmented nail R thumb  Lab Results  Component Value Date   HGBA1C 10.40 09/07/2014   Lab Results  Component Value Date   HGBA1C 10.30 12/14/2014        Assessment & Plan:

## 2014-12-14 NOTE — Assessment & Plan Note (Signed)
Fungus in R thumb nail- another 4 weeks of lamisil after you finish 10 days of clindamycin.

## 2014-12-20 ENCOUNTER — Ambulatory Visit: Payer: Medicaid Other | Attending: Family Medicine | Admitting: Family Medicine

## 2014-12-20 ENCOUNTER — Encounter: Payer: Self-pay | Admitting: Family Medicine

## 2014-12-20 VITALS — BP 109/59 | HR 81 | Temp 97.9°F | Resp 16 | Ht 61.0 in | Wt 149.0 lb

## 2014-12-20 DIAGNOSIS — L089 Local infection of the skin and subcutaneous tissue, unspecified: Secondary | ICD-10-CM

## 2014-12-20 DIAGNOSIS — B351 Tinea unguium: Secondary | ICD-10-CM

## 2014-12-20 DIAGNOSIS — IMO0002 Reserved for concepts with insufficient information to code with codable children: Secondary | ICD-10-CM

## 2014-12-20 DIAGNOSIS — E1165 Type 2 diabetes mellitus with hyperglycemia: Secondary | ICD-10-CM

## 2014-12-20 LAB — GLUCOSE, POCT (MANUAL RESULT ENTRY): POC GLUCOSE: 124 mg/dL — AB (ref 70–99)

## 2014-12-20 MED ORDER — TERBINAFINE HCL 250 MG PO TABS: 250.0000 mg | ORAL_TABLET | Freq: Every day | ORAL | Status: DC

## 2014-12-20 MED ORDER — MUPIROCIN 2 % EX OINT: 1.0000 "application " | TOPICAL_OINTMENT | Freq: Two times a day (BID) | CUTANEOUS | Status: DC

## 2014-12-20 NOTE — Progress Notes (Signed)
F/U rash under breast  Stated doing better

## 2014-12-20 NOTE — Assessment & Plan Note (Signed)
Skin rash: Improving, finish clindamycin I suspect MRSA, Nasal swab today. bactroban ointment to nose twice daily for 5 days

## 2014-12-20 NOTE — Patient Instructions (Addendum)
[name],  Thank you for coming in today.  1. Skin rash: Improving, finish clindamycin I suspect MRSA, Nasal swab today. bactroban ointment to nose twice daily for 5 days  2. Fungus in R great thumb: Start lamisil 250 mg daily for 8 weeks once done with clindamycin.  Dr. Adrian Blackwater      MRSA, Pacientes ambulatorios (Community-Associated MRSA)  CA-MRSA significa estafilococo aureus resistente a la meticilina asociado a la comunidad. Se trata de un tipo de bacteria resistente a algunos antibiticos que puede causar infecciones en la piel y otras zonas. El staphylococcus aureus es una bacteria que vive normalmente en la piel o en la nariz. el estafilococo que se encuentra en la superficie de la piel o en la nariz no causa problemas. Sin embargo, si ingresa al organismo a travs de un corte o una herida, puede producir una infeccin. Hasta no hace mucho tiempo, las infecciones por estafilococo tipo MRSA ocurran en hospitales y otros establecimientos sanitarios. Ahora se observa que causa infecciones en toda la comunidad. El SMRA ocasiona problemas de salud graves que pueden llevar a la [place]. Pero se est volviendo un problema cada vez ms frecuente. Se sabe que se contagia en lugares en los que hay mucha gente, en crceles y prisiones, y en situaciones en las que se producen contactos piel a piel, como durante eventos deportivos o en vestuarios. Puede contagiarse a travs de utensilios compartidos, como rasuradotas, toallas o equipos deportivos.  CAUSAS [employer] estafilococos, incluyendo el SAMR normalmente son inocuos excepto que puedan entrar al torrente sanguneo a travs de un rasguo, un corte o una herida como la de [place]. [employer] estafilococos, incluyendo el SAMR pueden contagiarse de Ardelia Mems persona a otra tocando objetos contaminados as como a travs del Youth worker.  MRSA ahora causa la enfermedad en aquellas personas que no han estado en hospitales u otros  establecimientos sanitarios. Los casos de enfermedades ocasionadas por MRSA en la comunidad se han asociado al:  Uso reciente de antibiticos.  Compartir toallas o ropa contaminada.  Padecer enfermedades activas de la piel.  Participar en deportes de contacto.  Vivir en condiciones de superpoblacin.  Uso de medicamentos por va intravenosa.  Las infecciones por MRSA relacionadas con la comunidad son normalmente infecciones de la piel, pero pueden causar otros trastornos graves.  La bacteria estafilococo es una de las causas ms comunes de infecciones de la piel. Sin embargo, tambin son causa frecuente de neumona, infecciones en los [employer] o las articulaciones e infecciones en la [place]. DIAGNSTICO El diagnstico se realiza a travs de cultivos o pruebas especiales de [place] de fluidos provenientes de:  Hisopado tomado de los cortes o heridas en las zonas infectadas.  Hisopados nasales.  Saliva o mucus proveniente de los pulmones (esputo).  Orina.  Sangre. Algunas personas son portadores, pero no presentan signos de infeccin. Esto significa que llevan el germen en su piel o en su nariz pero no desarrollan la infeccin.  TRATAMIENTO El tratamiento vara y se basa en la gravedad, la profundidad y la extensin del proceso infeccioso. Por ejemplo:  Algunas infecciones de la piel, como pequeos granos o abscesos pueden tratarse drenando el pus del lugar de la infeccin.  Las infecciones ms profundas o ms diseminadas de los tejidos blandos generalmente se tratan con ciruga para drenar el pus y antibiticos por va intravenosa o por boca. [employer] se recomienda an en el caso de las mujeres [place].  Las infecciones ms graves requieren la hospitalizacin. En el  caso de Designer, industrial/product antibiticos, el tratamiento durar varias semanas. PREVENCIN Debido a que [employer] son portadoras, la prevencin del contagio de la bacteria de Ardelia Mems persona a otra es lo ms  importante. El mejor modo de prevenir la diseminacin de las bacterias y otros grmenes es a travs del correcto lavado de las manos o usando desinfectantes con base de alcohol. A continuacin se indican otras formas de evitar el contagio en los establecimientos comunitario.   Stacy Gardner y frote sus manos con agua tibia y jabn durante al menos 15 segundos. Tambin puede usar desinfectantes con base de alcohol cuando no se dispone de [place] y [place].  Asegrese de [employer] personas con las que convive se lavan las manos con frecuencia.  No comparta utensilios personales. Por ejemplo, evite compartir rasuradoras, toallas, ropa y equipos deportivos.  Lave y seque su ropa, y la ropa de cama, a las temperaturas ms calientes recomendadas en las etiquetas.  [place] heridas cubiertas. El pus de las llagas infectadas puede contener las bacterias. Mantenga los cortes y las raspaduras limpios y cubiertos con una venda hasta que curen.  Si tiene una herida que parece estar infectada, consulte con su mdico si debe realizar un cultivo.  Si est amamantando, converse con su mdico acerca de este problema. Podr pedirle que suspenda por un tiempo la Therapist, nutritional. INSTRUCCIONES PARA EL CUIDADO DOMICILIARIO  Utilice los medicamentos tal como se le indic. No saltee medicamentos ni abandone su uso prematuramente slo porque cree que est mejorando.  Los que presentan este tipo de infeccin deben English as a second language teacher contacto con los que los rodean. No use toallas, rasuradotas, cepillos de dientes o ropa de cama que usarn [employer].  Para luchar contra la infeccin, siga las indicaciones de su mdico para el cuidado de la herida. Lvese bien las manos antes y despus de cambiarse el vendaje.  Si tiene un dispositivo intravascular, como un catter, asegrese que sabe como cuidarlo.  Asegrese de informar a todos los profesionales que lo asistan que usted sufre MRSA, de modo que puedan tomar las medidas necesarias  relacionadas con su infeccin. SOLICITE ATENCIN MDICA DE INMEDIATO SI:  La infeccin parece empeorar y observa:  Aumento del calor, inflamacin o dolor alrededor de la herida.  Aparece una lnea roja que se extiende desde el lugar de la infeccin.  La zona de la infeccin se torna de un color oscuro.  La herida drena un lquido de color marrn, amarillo o verdoso.  Despide un olor ftido.  Comienza a sentir nuseas y vmitos o no puede [employer].  Tiene fiebre.  Su beb tiene ms de 3 meses y su temperatura rectal es de 102 F (38.9 C) o ms.  Su beb tiene 3 meses o menos y su temperatura rectal es de 100.4 F (38 C) o ms.  Presenta dificultades respiratorias. EST SEGURO QUE:   Comprende las instrucciones para el alta mdica.  Controlar su enfermedad.  Solicitar atencin mdica de inmediato segn las indicaciones. Document Released: 11/20/2007 Document Revised: 11/05/2011 [facility] Patient Information 2015 [place]. This information is not intended to replace advice given to you by your health care provider. Make sure you discuss any questions you have with your health care provider.

## 2014-12-20 NOTE — Progress Notes (Signed)
   Subjective:    Patient ID: Brenda Andrews, female    DOB: 02-27-1946, 69 y.o.   MRN: [id number] CC: f/u skin rash x 2 years  HPI  Spanish interpreter present   1. Skin rash: x 2 years. Under both breast, flanks and gluteal area. Has hx of ID under L breast. Rash is itchy and painful. Improving on clindamycin. Small amount of itching. Redness has improved. Did develop a new area in her L posterior scalp, raised, drained white pus, painful.   2. R thumb nail fungus: s/p 8 weeks of lamisil with slight improvement in nail.    Soc hx: non smoker  Review of Systems  Constitutional: Negative for fever and chills.  Skin: Positive for color change and rash.      Objective:   Physical Exam  BP 109/59 mmHg  Pulse 81  Temp(Src) 97.9 F (36.6 C) (Oral)  Resp 16  Ht 5\' 1"  (1.549 m)  Wt 149 lb (67.586 kg)  BMI 28.17 kg/m2  SpO2 98% General appearance: alert, cooperative and no distress Head: scaly and flaky lesion R posterior scalp about 5x5 mm Skin: hyperpigmented macular rash under both breast laterally and b/l flanks. Also with hyperpigmented papule on gluteal areas b/l. Rash has no erythema. No tenderness. Nails: hyperpigmented nail R thumb       Assessment & Plan:

## 2014-12-20 NOTE — Assessment & Plan Note (Signed)
Fungus in R great thumb: Start lamisil 250 mg daily for 8 weeks once done with clindamycin.

## 2014-12-23 LAB — NASAL CULTURE (N/P): Organism ID, Bacteria: NORMAL

## 2014-12-28 ENCOUNTER — Telehealth: Payer: Self-pay | Admitting: *Deleted

## 2014-12-28 ENCOUNTER — Other Ambulatory Visit: Payer: Self-pay | Admitting: Family Medicine

## 2014-12-28 NOTE — Telephone Encounter (Signed)
Aware of results. 

## 2014-12-28 NOTE — Telephone Encounter (Signed)
-----   Message from Boykin Nearing, MD sent at 12/23/2014  1:26 PM EDT ----- Normal nose culture. No staph

## 2014-12-28 NOTE — Telephone Encounter (Signed)
LVM to return call.

## 2015-03-10 ENCOUNTER — Encounter: Payer: Self-pay | Admitting: Family Medicine

## 2015-03-10 ENCOUNTER — Telehealth: Payer: Self-pay | Admitting: Family Medicine

## 2015-03-10 ENCOUNTER — Ambulatory Visit: Payer: Medicaid Other | Attending: Family Medicine | Admitting: Family Medicine

## 2015-03-10 VITALS — BP 125/68 | HR 90 | Temp 99.7°F | Resp 16 | Ht 61.0 in

## 2015-03-10 DIAGNOSIS — Z0000 Encounter for general adult medical examination without abnormal findings: Secondary | ICD-10-CM

## 2015-03-10 DIAGNOSIS — H547 Unspecified visual loss: Secondary | ICD-10-CM | POA: Diagnosis not present

## 2015-03-10 DIAGNOSIS — R21 Rash and other nonspecific skin eruption: Secondary | ICD-10-CM | POA: Diagnosis not present

## 2015-03-10 DIAGNOSIS — L089 Local infection of the skin and subcutaneous tissue, unspecified: Secondary | ICD-10-CM | POA: Diagnosis not present

## 2015-03-10 DIAGNOSIS — E119 Type 2 diabetes mellitus without complications: Secondary | ICD-10-CM | POA: Insufficient documentation

## 2015-03-10 DIAGNOSIS — E1165 Type 2 diabetes mellitus with hyperglycemia: Secondary | ICD-10-CM | POA: Diagnosis not present

## 2015-03-10 DIAGNOSIS — IMO0002 Reserved for concepts with insufficient information to code with codable children: Secondary | ICD-10-CM

## 2015-03-10 LAB — POCT URINALYSIS DIPSTICK
BILIRUBIN UA: NEGATIVE
Glucose, UA: 500
Ketones, UA: NEGATIVE
Leukocytes, UA: NEGATIVE
NITRITE UA: NEGATIVE
PH UA: 6
Protein, UA: 30
Spec Grav, UA: 1.01
Urobilinogen, UA: 0.2

## 2015-03-10 LAB — GLUCOSE, POCT (MANUAL RESULT ENTRY)
POC Glucose: 504 mg/dl — AB (ref 70–99)
POC Glucose: 464 mg/dl — AB (ref 70–99)

## 2015-03-10 LAB — POCT GLYCOSYLATED HEMOGLOBIN (HGB A1C): HEMOGLOBIN A1C: 13.7

## 2015-03-10 MED ORDER — TRIAMCINOLONE ACETONIDE 0.1 % EX CREA: 1.0000 "application " | TOPICAL_CREAM | Freq: Two times a day (BID) | CUTANEOUS | Status: DC

## 2015-03-10 MED ORDER — GLUCOSE BLOOD VI STRP: 1.0000 | ORAL_STRIP | Freq: Three times a day (TID) | Status: DC

## 2015-03-10 MED ORDER — INSULIN ASPART PROT & ASPART (70-30 MIX) 100 UNIT/ML ~~LOC~~ SUSP: 30.0000 [IU] | Freq: Two times a day (BID) | SUBCUTANEOUS | Status: DC

## 2015-03-10 MED ORDER — INSULIN ASPART 100 UNIT/ML ~~LOC~~ SOLN
20.0000 [IU] | Freq: Once | SUBCUTANEOUS | Status: AC
  Administered 2015-03-10: 20 [IU] via SUBCUTANEOUS

## 2015-03-10 MED ORDER — ACCU-CHEK SOFTCLIX LANCETS MISC: 1.0000 | Freq: Three times a day (TID) | Status: DC

## 2015-03-10 MED ORDER — ZOSTER VACCINE LIVE 19400 UNT/0.65ML ~~LOC~~ SOLR: 0.6500 mL | Freq: Once | SUBCUTANEOUS | Status: DC

## 2015-03-10 NOTE — Assessment & Plan Note (Signed)
Poor vision: opthalmology referral placed  F/u with RN in 3 weeks F/u with me in 6 weeks

## 2015-03-10 NOTE — Progress Notes (Signed)
F/U DM  Complaining of

## 2015-03-10 NOTE — Telephone Encounter (Signed)
Called patient [facility] Left VM, d/c novolog and lantus.

## 2015-03-10 NOTE — Assessment & Plan Note (Signed)
Diabetes: Uncontrolled A1c elevated and blood sugar elevated  Check and write down sugars 2-3 times a day. Fasting and before insulin doses.  Diabetes blood sugar goals  Fasting (in AM before breakfast, 8 hrs of no eating or drinking (except water or unsweetened coffee or tea): 90-110 2 hrs after meals: < 160,   No low sugars: nothing < 70   Stop lantus and novolog  Start 70/30, 30 Units twice daily Meter and test strips ordered

## 2015-03-10 NOTE — Assessment & Plan Note (Signed)
Skin rash: Cause unclear Kenalog cream twice daily Dermatology referral

## 2015-03-10 NOTE — Assessment & Plan Note (Signed)
Shingles vaccine given

## 2015-03-10 NOTE — Patient Instructions (Addendum)
Brenda Andrews  1. Diabetes: Uncontrolled A1c elevated and blood sugar elevated  Check and write down sugars 2-3 times a day. Fasting and before insulin doses.  Diabetes blood sugar goals  Fasting (in AM before breakfast, 8 hrs of no eating or drinking (except water or unsweetened coffee or tea): 90-110 2 hrs after meals: < 160,   No low sugars: nothing < 70   Stop lantus and novolog  Start 70/30, 30 Units twice daily Meter and test strips ordered   2. Skin rash: Cause unclear Kenalog cream twice daily Dermatology referral  3. Poor vision: opthalmology referral placed  F/u with RN in 3 weeks F/u with me in 6 weeks

## 2015-03-10 NOTE — Progress Notes (Signed)
   Subjective:    Patient ID: Brenda Andrews, female    DOB: [birth year], 69 y.o.   MRN: [id number] CC: DM2 follow up, skin rash Spanish interpreter present  HPI  1 CHRONIC DIABETES  Disease Monitoring  Blood Sugar Ranges: 130s in AM at home, has not checked in 8 days   Polyuria: yes   Visual problems: yes   Medication Compliance: no, taking 30 U of novolog in the AM and 30 U of lantus in the evening   Medication Side Effects  Hypoglycemia: no   Preventitive Health Care  Eye Exam: due   2. Skin rash: recurrent hyperpigmented rash with tenderness along flanks. Had fever and chills x 2 days. None today.    Soc Hx: non smoker  Review of Systems  Constitutional: Positive for fever and chills.  Eyes: Positive for visual disturbance.  Respiratory: Negative for shortness of breath.   Cardiovascular: Negative for chest pain, palpitations and leg swelling.  Gastrointestinal: Negative for nausea, vomiting and abdominal pain.  Skin: Positive for rash.      Objective:   Physical Exam BP 125/68 mmHg  Pulse 90  Temp(Src) 99.7 F (37.6 C) (Oral)  Resp 16  Ht 5\' 1"  (1.549 m)  SpO2 97%  Wt Readings from Last 3 Encounters:  12/20/14 149 lb (67.586 kg)  12/14/14 141 lb (63.957 kg)  11/03/14 146 lb 9.6 oz (66.497 kg)  General appearance: alert, cooperative and no distress Lungs: clear to auscultation bilaterally Heart: regular rate and rhythm, S1, S2 normal, no murmur, click, rub or gallop Abdomen: soft, non-tender; bowel sounds normal; no masses,  no organomegaly Extremities: extremities normal, atraumatic, no cyanosis or edema  Skin: hyperpigmented papules in a circular distrubution on b/l flanks, mild TTP. No erythema   Lab Results  Component Value Date   HGBA1C 13.70 03/10/2015   CBG 504  UA: 500 glucose, negative ketone   Treatment: 20 U novolog NS via IV  500 ml  CBG 464      Assessment & Plan:

## 2015-03-11 ENCOUNTER — Telehealth: Payer: Self-pay | Admitting: *Deleted

## 2015-03-11 LAB — BASIC METABOLIC PANEL
BUN: 26 mg/dL — AB (ref 6–23)
CO2: 21 meq/L (ref 19–32)
Calcium: 8.5 mg/dL (ref 8.4–10.5)
Chloride: 95 mEq/L — ABNORMAL LOW (ref 96–112)
Creat: 1.98 mg/dL — ABNORMAL HIGH (ref 0.50–1.10)
GLUCOSE: 496 mg/dL — AB (ref 70–99)
POTASSIUM: 5.2 meq/L (ref 3.5–5.3)
Sodium: 126 mEq/L — ABNORMAL LOW (ref 135–145)

## 2015-03-11 NOTE — Telephone Encounter (Signed)
-----   Message from Boykin Nearing, MD sent at 03/11/2015  8:49 AM EDT ----- High sugar of 496. Low sodium but corrects to 132-136 which is low/normal.  Patient must drink plenty of water Monitor sugars Call with update next week so adjustments can be made if too high or too low

## 2015-03-18 ENCOUNTER — Encounter: Payer: Self-pay | Admitting: Internal Medicine

## 2015-03-24 ENCOUNTER — Ambulatory Visit: Payer: Medicaid Other | Attending: Family Medicine | Admitting: Pharmacist

## 2015-03-24 DIAGNOSIS — Z0000 Encounter for general adult medical examination without abnormal findings: Secondary | ICD-10-CM | POA: Diagnosis not present

## 2015-03-24 MED ORDER — ZOSTER VACCINE LIVE 19400 UNT/0.65ML ~~LOC~~ SOLR
0.6500 mL | Freq: Once | SUBCUTANEOUS | Status: AC
  Administered 2015-03-24: 19400 [IU] via SUBCUTANEOUS

## 2015-03-24 NOTE — Progress Notes (Signed)
Patient arrived for zoster vaccination.  Patient is Spanish-speaking. Telephone interpreter used for visit.  Injection given. Information concerning the vaccination was provided to the patient in [place]. Patient denied having any questions concerning the vaccination.

## 2015-03-24 NOTE — Patient Instructions (Signed)
Varicella-Zoster Virus Vaccine Live injection What is this medicine? VARICELLA VIRUS VACCINE (var uh SEL uh VAHY ruhs vak SEEN) is used to prevent infections of chickenpox. HERPES ZOSTER VIRUS VACCINE (HUR peez ZOS ter vahy ruhs vak SEEN) is used to prevent shingles in adults 70 years old and over. This vaccine is not used to treat shingles or nerve pain from shingles. These medicines may be used for other purposes; ask your health care provider or pharmacist if you have questions. This medicine may be used for other purposes; ask your health care provider or pharmacist if you have questions. COMMON BRAND NAME(S): Varivax, Zostavax What should I tell my health care provider before I take this medicine? They need to know if you have any of the following conditions: -blood disorders or disease -cancer like leukemia or lymphoma -immune system problems or therapy -infection with fever -recent immune globulin therapy -tuberculosis -an unusual or allergic reaction to vaccines, neomycin, gelatin, other medicines, foods, dyes, or preservatives -pregnant or trying to get pregnant -breast-feeding How should I use this medicine? These vaccines are for injection under the skin. They are given by a health care professional. A copy of Vaccine Information Statements will be given before each varicella virus vaccination. Read this sheet carefully each time. The sheet may change frequently. A Vaccine Information Statement is not given before the herpes zoster virus vaccine. Talk to your pediatrician regarding the use of the varicella virus vaccine in children. While this drug may be prescribed for children as young as 76 months of age for selected conditions, precautions do apply. The herpes zoster virus vaccine is not approved in children. Overdosage: If you think you have taken too much of this medicine contact a poison control center or emergency room at once. NOTE: This medicine is only for you. Do not  share this medicine with others. What if I miss a dose? Keep appointments for follow-up (booster) doses of varicella virus vaccine as directed. It is important not to miss your dose. Call your doctor or health care professional if you are unable to keep an appointment. Follow-up (booster) doses are not needed for the herpes zoster virus vaccine. What may interact with this medicine? Do not take these medicines with any of the following medications: -adalimumab -anakinra -etanercept -infliximab -medicines that suppress your immune system -medicines to treat cancer These medicines may also interact with the following medications: -aspirin and aspirin-like medicines (varicella virus vaccine only) -blood transfusions (varicella virus vaccine only) -immunoglobulins (varicella virus vaccine only) -steroid medicines like prednisone or cortisone This list may not describe all possible interactions. Give your health care provider a list of all the medicines, herbs, non-prescription drugs, or dietary supplements you use. Also tell them if you smoke, drink alcohol, or use illegal drugs. Some items may interact with your medicine. What should I watch for while using this medicine? Visit your doctor for regular check ups. These vaccines, like all vaccines, may not fully protect everyone. After receiving these vaccines it may be possible to pass chickenpox infection to others. For up to 6 weeks, avoid people with immune system problems, pregnant women who have not had chickenpox, newborns of women who have not had chickenpox, and all newborns born at less than 28 weeks of pregnancy. Talk to your doctor for more information. Do not become pregnant for 3 months after taking these vaccines. Women should inform their doctor if they wish to become pregnant or think they might be pregnant. There is a potential for serious  side effects to an unborn child. Talk to your health care professional or pharmacist for more  information. What side effects may I notice from receiving this medicine? Side effects that you should report to your doctor or health care professional as soon as possible: -allergic reactions like skin rash, itching or hives, swelling of the face, lips, or tongue -breathing problems -extreme changes in behavior -feeling faint or lightheaded, falls -fever over 102 degrees F -pain, tingling, numbness in the hands or feet -redness, blistering, peeling or loosening of the skin, including inside the mouth -seizures -unusually weak or tired Side effects that usually do not require medical attention (report to your doctor or health care professional if they continue or are bothersome): -aches or pains -chickenpox-like rash -diarrhea -headache -low-grade fever under 102 degrees F -loss of appetite -nausea, vomiting -redness, pain, swelling at site where injected -sleepy -trouble sleeping This list may not describe all possible side effects. Call your doctor for medical advice about side effects. You may report side effects to FDA at 1-800-FDA-1088. Where should I keep my medicine? These drugs are given in a hospital or clinic and will not be stored at home. NOTE: This sheet is a summary. It may not cover all possible information. If you have questions about this medicine, talk to your doctor, pharmacist, or health care provider.  2015, Elsevier/Gold Standard. (2013-04-17 14:24:35) Varicella-Zoster Immune Globulin (Human) injection solution Qu es este medicamento? VARICELA-ZSTER INMUNOGLOBULINA ayuda reducir la severidad de las infecciones de varicela en pacientes que estn en riesgo. Este medicamento se recoge de la sangre combinada de muchos donantes. Este medicamento puede ser utilizado para otros usos; si tiene alguna pregunta consulte con su proveedor de atencin mdica o con su farmacutico. MARCAS COMERCIALES DISPONIBLES: Cristy Folks le debo informar a mi profesional de la salud  antes de tomar este medicamento? Necesita saber si usted presenta alguno de los siguientes problemas o situaciones: -trastorno sanguneo -diabetes -enfermedad cardiaca -alto nivel de colesterol -antecedentes de cogulos sanguneos -deficiencia de IgA -conteos sanguneos bajos, como baja cantidad de glbulos blancos, glbulos rojos y plaquetas -vacunacin reciente o programado para recibir una vacuna -una reaccin alrgica o inusual a la varicela-zster inmunoglobulina, a otras inmunoglobulinas, a otros medicamentos, alimentos, colorantes o conservantes -si est embarazada, si cree que puede estar embarazada o si est buscando quedar embarazada -si est amamantando a un beb Cmo debo utilizar este medicamento? Esta vacuna se administra mediante inyeccin por va intramuscular. Generalmente lo administra un profesional de Technical sales engineer en un hospital o en un entorno clnico. Hable con su pediatra para informarse acerca del uso de este medicamento en nios. Aunque este medicamento se puede recetar a bebes recin nacidos para condiciones selectivas, las precauciones se aplican. Sobredosis: Pngase en contacto inmediatamente con un centro toxicolgico o una sala de urgencia si usted cree que haya tomado demasiado medicamento. ATENCIN: [employer] es solo para usted. No comparta este medicamento con nadie. Qu sucede si me olvido de una dosis? No se aplica en este caso. Qu puede interactuar con este medicamento? -vacunas de virus vivos Puede ser que esta lista no menciona todas las posibles interacciones. Informe a su profesional de [employer]	[place] de [employer] productos a base de hierbas, medicamentos de [place] o suplementos nutritivos que est tomando. Si usted fuma, consume bebidas alcohlicas o si utiliza drogas ilegales, indqueselo tambin a su profesional de [employer]	[place]. Algunas sustancias pueden interactuar con su medicamento. A qu debo estar atento al usar [employer]? Este  medicamento  est hecho de [employer]. Es posible que se transmita una infeccin a travs del medicamento, aun no ha Dentist. Consulte a su mdico [employer] y los beneficios de [employer]. Este medicamento puede disminuir la respuesta a una vacuna. Si necesita vacunarse, informe a su profesional de la salud si ha recibido [employer] ltimos 3 meses. Puede necesitar dosis adicionales de refuerzo. Consulte a su mdico para determinar si se necesita un calendario de vacunacin diferente. Se supervisar su estado de salud atentamente mientras reciba este medicamento. Qu efectos secundarios puedo tener al [employer] este medicamento? Efectos secundarios que debe informar a su mdico o a Barrister's clerk de la salud tan pronto como sea posible: -Chief of Staff como erupcin cutnea, picazn o urticarias, hinchazn de la cara, labios o lengua -falta de aliento, dolor en el pecho, hinchazn en la pierna Efectos secundarios que, por lo general, no requieren atencin mdica (debe informarlos a su mdico o a su profesional de la salud si persisten o si son molestos): -escalofros -dolor de cabeza -dolor, enrojecimiento o Actor de la inyeccin Puede ser que esta lista no menciona todos los posibles efectos secundarios. Comunquese a su mdico por asesoramiento mdico [employer] Usted puede informar los efectos secundarios a la FDA por telfono al 1-800-FDA-1088. Dnde debo guardar mi medicina? Este medicamento se administra en hospitales o clnicas y no necesitar guardarlo en su domicilio. ATENCIN: Este folleto es un resumen. Puede ser que no cubra toda la posible informacin. Si usted tiene preguntas acerca de esta medicina, consulte con su mdico, su farmacutico o su profesional de Technical sales engineer.  2015, Elsevier/[employer] Standard. (2011-09-10 15:21:15) Herpes Zoster Virus Vaccine Qu es este medicamento? La VACUNA CONTRA EL VIRUS DEL  HERPES ZSTER es una vacuna. Se [place] para prevenir la [employer] de 50 aos de edad o [place]. Esta vacuna no se utiliza para tratar la culebrilla o el dolor en los nervios causado por la culebrilla. Este medicamento puede ser utilizado para otros usos; si tiene alguna pregunta consulte con su proveedor de atencin mdica o con su farmacutico. MARCAS COMERCIALES DISPONIBLES: Varivax, Zostavax Qu le debo informar a mi profesional de la salud antes de tomar este medicamento? Necesita saber si usted presenta alguno de los siguientes problemas o situaciones: -cncer como leucemia o linfoma -terapia o problemas del sistema inmunolgico -infeccin con fiebre -tuberculosis -una reaccin alrgica o inusual a la vacunas, a la neomicina, a la gelatina, a otros medicamentos, alimentos, colorantes o conservantes -si est embarazada, si cree que puede estar embarazada o si est buscando quedar embarazada -si est amamantando a un beb Cmo debo utilizar este medicamento? Esta vacuna se administra mediante inyeccin por va subcutnea. Lo administra un profesional de [employer]	[place]. Hable con su pediatra para informarse acerca del uso de este medicamento en nios. Este medicamento no est aprobado para uso en nios. Sobredosis: Pngase en contacto inmediatamente con un centro toxicolgico o una sala de urgencia si usted cree que haya tomado demasiado medicamento. ATENCIN: [employer] es solo para usted. No comparta este medicamento con nadie. Qu sucede si me olvido de una dosis? No se aplica en este caso. Qu puede interactuar con este medicamento? No tome esta medicina con ninguno de los siguientes medicamentos: -adalimumab -anakinra -etanercept -infliximab -medicamentos para tratar el cncer -medicamentos que suprimen el sistema inmunolgico Esta medicina tambin puede interactuar con los siguientes medicamentos: -inmunoglobulinas -medicamentos esteroideos, como la  prednisona o la cortisona Puede ser  que esta lista no menciona todas las posibles interacciones. Informe a su profesional de [employer]	[place] de [employer] productos a base de hierbas, medicamentos de [place] o suplementos nutritivos que est tomando. Si usted fuma, consume bebidas alcohlicas o si utiliza drogas ilegales, indqueselo tambin a su profesional de [employer]	[place]. Algunas sustancias pueden interactuar con su medicamento. A qu debo estar atento al usar [employer]? Visite a su mdico para chequeos peridicos. Es posible que esta vacuna, como todas las vacunas, no proteja completamente a todos. Despus de recibir la vacuna es posible contagiar la infeccin por varicela a otros. Debe evitar el contacto con personas con problemas del sistema inmunolgico, mujeres embarazadas que no han tenido la varicela y los bebs recin nacidos de las mujeres que no han tenido la varicela. Consulte a su mdico para ms informacin. Qu efectos secundarios puedo tener al [employer] este medicamento? Efectos secundarios que debe informar a su mdico o a Barrister's clerk de la salud tan pronto como sea posible: -Chief of Staff como erupcin cutnea, picazn o urticarias, hinchazn de la cara, labios o lengua -problemas respiratorios -sensacin de desmayos o mareos, cadas -fiebre, sntomas tipo gripales -dolor, hormigueo, entumecimiento en las manos o pies -hinchazn de los tobillos, pies, manos -cansancio o debilidad inusual Efectos secundarios que, por lo general, no requieren atencin mdica (debe informarlos a su mdico o a su profesional de la salud si persisten o si son molestos): -molestias o dolores -sarpullido tipo varicella -diarrea -dolor de cabeza -prdida del apetito -nuseas, vmito -enrojecimiento, dolor, hinchazn en el lugar de la inyeccin -goteo de la Lawyer Puede ser que esta lista no menciona todos los posibles efectos secundarios. Comunquese a su mdico por asesoramiento mdico  [employer] Usted puede informar los efectos secundarios a la FDA por telfono al 1-800-FDA-1088. Dnde debo guardar mi medicina? Este medicamento se administra en hospitales o clnicas y no necesitar guardarlo en su domicilio. ATENCIN: Este folleto es un resumen. Puede ser que no cubra toda la posible informacin. Si usted tiene preguntas acerca de esta medicina, consulte con su mdico, su farmacutico o su profesional de Technical sales engineer.  2015, Elsevier/Gold Standard. (2010-02-07 15:33:42)

## 2015-03-24 NOTE — Assessment & Plan Note (Signed)
Zostavax ordered

## 2015-03-30 ENCOUNTER — Ambulatory Visit: Payer: Medicaid Other | Attending: Family Medicine | Admitting: Pharmacist

## 2015-03-30 DIAGNOSIS — IMO0002 Reserved for concepts with insufficient information to code with codable children: Secondary | ICD-10-CM

## 2015-03-30 DIAGNOSIS — E1165 Type 2 diabetes mellitus with hyperglycemia: Secondary | ICD-10-CM

## 2015-03-30 MED ORDER — ACCU-CHEK SOFTCLIX LANCETS MISC: 1.0000 | Freq: Three times a day (TID) | Status: DC

## 2015-03-30 MED ORDER — "NEEDLE (DISP) 30G X 1/2"" MISC": Status: DC

## 2015-03-30 NOTE — Patient Instructions (Addendum)
La diabetes mellitus y los alimentos (Diabetes Mellitus and Food) Es importante que controle su nivel de azcar en la sangre (glucosa). El nivel de glucosa en sangre depende en gran medida de lo que usted come. Comer alimentos saludables en las cantidades [place] a lo largo del Training and development officer, aproximadamente a la misma hora [employer], lo ayudar a Chief Technology Officer su nivel de Multimedia programmer. Tambin puede ayudarlo a retrasar o Patent attorney de la diabetes mellitus. Comer de [employer] saludable incluso puede ayudarlo a Chartered loss adjuster de presin arterial y a Science writer o Theatre manager un peso saludable.  CMO PUEDEN AFECTARME LOS ALIMENTOS? Carbohidratos Los carbohidratos afectan el nivel de glucosa en sangre ms que cualquier otro tipo de alimento. El nutricionista lo ayudar a Teacher, adult education cuntos carbohidratos puede consumir en cada comida y ensearle a contarlos. El recuento de carbohidratos es importante para mantener la glucosa en sangre en un nivel saludable, en especial si utiliza insulina o toma determinados medicamentos para la diabetes mellitus. Alcohol El alcohol puede provocar disminuciones sbitas de la glucosa en sangre (hipoglucemia), en especial si utiliza insulina o toma determinados medicamentos para la diabetes mellitus. La hipoglucemia es una afeccin que puede poner en peligro la vida. Los sntomas de la hipoglucemia (somnolencia, mareos y Data processing manager) son similares a los sntomas de haber consumido mucho alcohol.  Si el mdico lo autoriza a beber alcohol, hgalo con moderacin y siga estas pautas:  Las mujeres no deben beber ms de un trago por da, y los hombres no deben beber ms de dos tragos por Training and development officer. Un trago es igual a:  12 onzas (355 ml) de cerveza  5 onzas de vino (150 ml) de vino  1,5onzas (29ml) de bebidas espirituosas  No beba con el estmago vaco.  Mantngase hidratado. Beba agua, gaseosas dietticas o t helado sin azcar.  Las gaseosas comunes, los jugos y  otros refrescos podran contener muchos carbohidratos y se Civil Service fast streamer. QU ALIMENTOS NO SE RECOMIENDAN? Cuando haga las elecciones de alimentos, es importante que recuerde que todos los alimentos son distintos. Algunos tienen menos nutrientes que otros por porcin, aunque podran tener la misma cantidad de caloras o carbohidratos. Es difcil darle al cuerpo lo que necesita cuando consume alimentos con menos nutrientes. Estos son algunos ejemplos de alimentos que debera evitar ya que contienen muchas caloras y carbohidratos, pero pocos nutrientes:  Physicist, medical trans (la mayora de los alimentos procesados incluyen grasas trans en la etiqueta de Informacin nutricional).  Gaseosas comunes.  Jugos.  Caramelos.  Dulces, como tortas, pasteles, rosquillas y [place].  Comidas fritas. QU ALIMENTOS PUEDO COMER? Consuma alimentos ricos en nutrientes, que nutrirn el cuerpo y lo mantendrn saludable. Los alimentos que debe comer tambin dependern de varios factores, como:  Las caloras que necesita.  Los medicamentos que toma.  Su peso.  El nivel de glucosa en [place].  El [place] de presin arterial.  El nivel de colesterol. Tambin debe consumir una variedad de [place], como:  Protenas, como carne, aves, pescado, tofu, frutos secos y semillas (las protenas de [place] magros son mejores).  Lambert Mody.  Verduras.  Productos lcteos, como [place], queso y yogur (descremados son mejores).  Panes, granos, pastas, cereales, arroz y frijoles.  Grasas, como aceite de [place], [place] sin grasas trans, aceite de canola, aguacate y [place]. TODOS LOS QUE PADECEN DIABETES MELLITUS TIENEN EL [place] PLAN DE [place]? Dado que todas las personas que padecen diabetes mellitus son distintas, no hay un solo plan de comidas que funcione para todos. Es  muy importante que se rena con un nutricionista que lo ayudar a crear un plan de comidas adecuado para usted. Document Released: 11/20/2007  Document Revised: 08/18/2013 [facility] Patient Information 2015 [place]. This information is not intended to replace advice given to you by your health care provider. Make sure you discuss any questions you have with your health care provider. Recuento bsico de carbohidratos para la diabetes mellitus (Basic Carbohydrate Counting for Diabetes Mellitus) El recuento de carbohidratos es un mtodo destinado a calcular la cantidad de carbohidratos en la dieta. El consumo de carbohidratos aumenta naturalmente el nivel de azcar (glucosa) en la sangre, por lo que es importante que sepa la cantidad que debe incluir en cada comida. El recuento de carbohidratos ayuda a Advertising account executive de glucosa en la sangre dentro de los lmites normales. La cantidad permitida de carbohidratos es diferente para cada persona. Un nutricionista puede ayudarlo a calcular la cantidad adecuada para usted. Una vez que sepa la cantidad de carbohidratos que puede consumir, podr calcular los carbohidratos de los alimentos que desea comer. Los siguientes alimentos incluyen carbohidratos:  Granos, como panes y cereales.  Frijoles secos y productos con soja.  Vegetales almidonados, como papas, guisantes y maz.  Lambert Mody y jugos de frutas.  Leche y Estate agent.  Dulces y bocadillos, como pastel, galletas, caramelos, papas fritas de bolsa, refrescos y bebidas frutales con azcar. RECUENTO DE CARBOHIDRATOS [employer] de calcular los carbohidratos de los alimentos. Puede usar cualquiera de los dos mtodos o [place] combinacin de [place]. Leer la etiqueta de informacin nutricional de los alimentos envasados La informacin nutricional es una etiqueta incluida en casi todas las bebidas y los alimentos envasados de los [place]. Indica el tamao de la porcin de ese alimento o bebida e informacin sobre los nutrientes de cada porcin, incluso los gramos (g) de carbohidratos por porcin.  Decida la cantidad de porciones que comer  o tomar de este alimento o bebida. Multiplique la cantidad de porciones por el nmero de gramos de carbohidratos indicados en la etiqueta para esa porcin. El total ser la cantidad de carbohidratos que consumir al comer ese alimento o tomar esa bebida. Conocer las porciones estndar de los alimentos Cuando coma alimentos no envasados o que no incluyan la informacin nutricional en la etiqueta, deber medir las porciones para poder calcular la cantidad de carbohidratos. Una porcin de la mayora de los alimentos ricos en carbohidratos contiene alrededor de 15g de carbohidratos. La siguiente [employer] tamaos de porcin de los alimentos ricos en carbohidratos que contienen alrededor de 15g de carbohidratos por porcin:   1rebanada de pan (1oz) o 1tortilla de seis pulgadas.  panecillo de hamburguesa o bollito tipo ingls.  4a 6galletas.   de taza de cereal sin azcar y seco.   taza de cereal caliente.   de taza de arroz o pastas.  taza de pur de papas o de una papa grande al horno.  1taza de frutas frescas o una fruta pequea.  taza de frutas o jugo de frutas enlatados o congelados.  [place].   de taza de yogur descremado sin ningn agregado o de yogur endulzado con edulcorante artificial.  taza de vegetales almidonados, como guisantes, maz o papas, o de frijoles secos cocidos. Decida la cantidad de porciones Gaffer. Multiplique la cantidad de porciones por 15 (los gramos de carbohidratos en esa porcin). Por ejemplo, si come 2tazas de fresas, habr comido 2porciones y 30g de carbohidratos (2porciones x 15g =  30g). Para las comidas como sopas y guisos, en las que se mezcla ms de un alimento, deber [employer] carbohidratos de cada alimento incluido. EJEMPLO DE RECUENTO DE CARBOHIDRATOS Ejemplo de cena  3 onzas de pechugas de pollo.   de taza de arroz integral.   taza de [place].  1 taza de fresas con  crema batida sin azcar. Clculo de [place] 1: Identifique los alimentos que contienen carbohidratos:   Arroz.  Maz.  Leche.  Hughie Closs. [place] 2: Calcule el nmero de porciones que consumir de cada uno:   2 porciones de Occupational psychologist.  1 porcin de maz.  [place].  1 porcin de fresas. [place] 3: Multiplique cada una de esas porciones por 15g:   2 porciones de arroz x 15 g = 30 g.  1 porcin de maz x 15 g = 15 g.  1 porcin de leche x 15 g = 15 g.  1 porcin de fresas x 15 g = 15 g. [place] 4: Sume todas las cantidades para Armed forces logistics/support/administrative officer total de gramos de carbohidratos consumidos: 30 g + 15 g + 15 g + 15 g = 75 g. Document Released: 11/05/2011 Document Revised: 12/28/2013 [facility] Patient Information 2015 [place]. This information is not intended to replace advice given to you by your health care provider. Make sure you discuss any questions you have with your health care provider. Control del nivel de glucosa en la sangre (Blood Glucose Monitoring) El control de la glucosa en la sangre (tambin llamada azcar en la sangre) lo ayudar a tener la diabetes bajo control. Tambin ayuda a que usted y Chief Financial Officer la diabetes y determinen si el tratamiento es Armed forces logistics/support/administrative officer. POR QU HAY QUE CONTROLAR LA GLUCOSA EN LA SANGRE?  Esto puede ayudar a comprender de [employer], la actividad fsica y los medicamentos inciden en los niveles de [place].  Le permite conocer el nivel de glucosa en la sangre en cualquier momento dado. Puede saber rpidamente si el nivel es bajo (hipoglucemia) o alto (hiperglucemia).  Puede ser de ayuda para que usted y el mdico sepan cmo Water engineer,  y para entender cmo controlar una enfermedad o ajustar los medicamentos para hacer ejercicio. CUNDO DEBE HACERSE LAS PRUEBAS? El mdico lo ayudar a decidir con qu frecuencia deber [employer] niveles de glucosa en la [place]. Esto puede depender del tipo de  diabetes que tenga, su control de la diabetes o los tipos de medicamentos que tome. Asegrese de anotar todos los valores de la glucosa en la [place], de modo que esta informacin pueda ser revisada por su mdico. A continuacin puede ver ejemplos de los momentos para Optometrist la prueba que el mdico puede Event organiser. Diabetes tipo1  [employer] prueba 4 veces por da si est bien controlado, [place] una bomba de [place] o se aplica muchas inyecciones diarias.  Si la diabetes no est bien controlada o si est enfermo, puede ser necesario que se controle con ms frecuencia.  Es recomendable que tambin se controle de [place] modo:  Antes y despus de hacer ejercicio.  [place] comidas y 2horas despus de Scientist, research (physical sciences).  Ocasionalmente, entre las 2:00a.m. y las 3:00a.m. Diabetes tipo2  Puede ser diferente para cada persona, pero, en general, si recibe insulina, hgase la prueba 4veces por da.  Si toma medicamentos por boca (va oral), hgase la prueba 2veces por da.  Si sigue una dieta controlada, hgase la prueba una vez por  da.  Si la diabetes no est bien controlada o si est enfermo, puede ser necesario que se controle con ms frecuencia. CMO CONTROLAR EL NIVEL DE GLUCOSA EN LA SANGRE Insumos necesarios  Medidor de glucosa en la sangre.  Tiras reactivas para el medidor. Cada medidor tiene sus propias tiras reactivas. [place] tiras reactivas correspondientes a su medidor.  Una aguja para pinchar (lanceta).  Un dispositivo que sujeta la lanceta (dispositivo de puncin).  Un diario o libro de anotaciones para [employer]. Procedimiento  Lave sus manos con agua y [place]. No se recomienda usar alcohol.  Pnchese el costado del dedo (no la punta) con Retail buyer.  Apriete suavemente el dedo hasta que aparezca una pequea gota de [place].  Siga las instrucciones que vienen con el medidor para Garment/textile technologist tira Comptroller, Midwife la sangre sobre la tira y usar el medidor de  Printmaker. Otras zonas de las que se puede tomar sangre para la prueba Algunos medidores le permiten tomar sangre para la prueba de otras zonas del cuerpo (que no son el dedo). Estas reas se llaman sitios alternativos. Los sitios alternativos ms comunes son los siguientes:  El Management consultant.  El muslo.  La zona posterior de la parte inferior de la pierna.  La palma de la mano. El flujo de sangre en estas zonas es ms lento. Por lo tanto, los valores de glucosa en la sangre que obtenga pueden estar demorados, y los nmeros son diferentes de los que obtiene de los dedos. No saque sangre de sitios alternativos si cree que tiene hipoglucemia. Los valores no sern precisos. Siempre extraiga del dedo si tiene hipoglucemia. Adems, si no puede darse cuenta cuando tiene bajos los niveles (hipoglucemia asintomtica), siempre extraiga sangre de los dedos para los controles de glucosa en la [place]. CONSEJOS ADICIONALES PARA EL CONTROL DE LA GLUCOSA  No vuelva a [place] lancetas.  Siempre tenga los insumos a mano.  Todos los medidores de glucosa incluyen un nmero de telfono "directo", disponible las 24 horas, al que podr llamar si tiene preguntas o [place].  Ajuste (calibre) el medidor de glucosa con una solucin de control despus de terminar algunas cajas de tiras reactivas. LLEVE REGISTROS DE LOS NIVELES DE GLUCOSA EN LA SANGRE Es recomendable llevar un diario o un registro de los valores de glucosa en la [place]. La [employer] de los medidores de glucosa, sino todos, conservan el registro de la glucosa en el dispositivo. Algunos medidores permiten descargar los registros a su computadora. Llevar un registro de los valores de glucosa en la sangre es especialmente til si desea observar los patrones. Haga anotaciones simultneas con la Teacher, English as a foreign language de los valores de glucosa en la sangre debido a que podra olvidar lo que ocurri en el momento exacto. Llevar un buen registro los ayudar a  usted y al mdico a Fish farm manager juntos para Scientist, forensic un buen control de la diabetes.  Document Released: 08/13/2005 Document Revised: 12/28/2013 [facility] Patient Information 2015 [place], [place]. This information is not intended to replace advice given to you by your health care provider. Make sure you discuss any questions you have with your health care provider.

## 2015-03-30 NOTE — Progress Notes (Signed)
S:    Patient speaks [place] and Winona, our in-person interpreter, was present for the entire visit. Presents for diabetes follow-up.  Patient reports adherence with medications. Current diabetes medications include insulin 70/30 30 units BID. She reports that she injects it into her abdomen.  Patient reports one subjective hypoglycemic event. It occurred after a nap. She reports treating it with water and some fruit.   Patient reported dietary habits: veggies, fish, chicken, salads, fruit, nuts, water  Patient reported exercise habits: rides bicycle 15-30 mins about 2-3 times per week.   Patient reports nocturia 2-3 per night. Patient denies neuropathy. Patient denies visual changes.    O:  . Lab Results  Component Value Date   HGBA1C 13.70 03/10/2015     Home fasting CBG: 110 - 235 (most <100) 2 hour post-prandial/random CBG: 229.  A/P: Diabetes currently uncontrolled based on A1c of 13.7 but under improved control based on home CBGs. Reports hypoglycemic events and is able to verbalize appropriate hypoglycemia management plan - re-educated patient on using regular soda or juice for low blood . Reports adherence with medication. Control is suboptimal due to lack of physical activity and switch in insulins from basal/bolus to mixed insulin.   Next A1C anticipated October 2016.  Written patient instructions provided.  Follow up in Pharmacist Clinic Visit as needed - next visit with Dr. Adrian Blackwater.   Total time in face to face counseling 35 minutes.  Patient seen with Bennye Alm, PharmD.

## 2015-04-27 ENCOUNTER — Telehealth: Payer: Self-pay

## 2015-04-27 ENCOUNTER — Telehealth: Payer: Self-pay | Admitting: Family Medicine

## 2015-04-27 NOTE — Telephone Encounter (Signed)
Raquel patient's family member is returning call to review lancets that need to be ordered, pt states it is accu-check fastclix lancets

## 2015-04-27 NOTE — Telephone Encounter (Signed)
Patients family member called explaining the pharmacy told them they have been prescribed the wrong lancets. The family member is requesting accuchek lancets to be ordered. Nurse called pharmacy. Pharmacy does not know what the family member is reporting. Patient has picked up lancets and they were the correct lancets.

## 2015-04-27 NOTE — Telephone Encounter (Signed)
LVM to return call- phone # [phone]  [phone] has call restriction, unable to contact pt

## 2015-04-27 NOTE — Telephone Encounter (Signed)
Pt family member Raquel Alinda Sierras stated wrong lancet was order "the lancet order wont fit in pen" Raquel will stop by pharmacy to order the correct lancet

## 2015-04-28 ENCOUNTER — Other Ambulatory Visit: Payer: Self-pay | Admitting: *Deleted

## 2015-04-28 MED ORDER — ACCU-CHEK FASTCLIX LANCETS MISC: Status: DC

## 2015-04-28 NOTE — Telephone Encounter (Signed)
Refills send to [facility]

## 2015-05-04 ENCOUNTER — Other Ambulatory Visit: Payer: Self-pay | Admitting: Family Medicine

## 2015-05-23 ENCOUNTER — Other Ambulatory Visit: Payer: Self-pay | Admitting: *Deleted

## 2015-05-23 ENCOUNTER — Telehealth: Payer: Self-pay | Admitting: Family Medicine

## 2015-05-23 DIAGNOSIS — Z0000 Encounter for general adult medical examination without abnormal findings: Secondary | ICD-10-CM

## 2015-05-23 DIAGNOSIS — IMO0002 Reserved for concepts with insufficient information to code with codable children: Secondary | ICD-10-CM

## 2015-05-23 DIAGNOSIS — E1165 Type 2 diabetes mellitus with hyperglycemia: Secondary | ICD-10-CM

## 2015-05-23 MED ORDER — LISINOPRIL 5 MG PO TABS: 5.0000 mg | ORAL_TABLET | Freq: Every day | ORAL | Status: DC

## 2015-05-23 NOTE — Telephone Encounter (Signed)
Patient requested a referral for a yearly mammogram. Please f/u with pt.

## 2015-05-26 NOTE — Telephone Encounter (Signed)
Referral placed.

## 2015-05-30 ENCOUNTER — Encounter: Payer: Self-pay | Admitting: Clinical

## 2015-05-30 ENCOUNTER — Encounter: Payer: Self-pay | Admitting: Family Medicine

## 2015-05-30 ENCOUNTER — Ambulatory Visit: Payer: Medicaid Other | Attending: Family Medicine | Admitting: Family Medicine

## 2015-05-30 VITALS — BP 96/59 | HR 78 | Temp 98.6°F | Resp 18 | Ht 60.0 in | Wt 138.0 lb

## 2015-05-30 DIAGNOSIS — Z0000 Encounter for general adult medical examination without abnormal findings: Secondary | ICD-10-CM | POA: Diagnosis not present

## 2015-05-30 DIAGNOSIS — J302 Other seasonal allergic rhinitis: Secondary | ICD-10-CM | POA: Diagnosis not present

## 2015-05-30 DIAGNOSIS — F32A Depression, unspecified: Secondary | ICD-10-CM

## 2015-05-30 DIAGNOSIS — E118 Type 2 diabetes mellitus with unspecified complications: Secondary | ICD-10-CM

## 2015-05-30 DIAGNOSIS — E1165 Type 2 diabetes mellitus with hyperglycemia: Secondary | ICD-10-CM | POA: Insufficient documentation

## 2015-05-30 DIAGNOSIS — F329 Major depressive disorder, single episode, unspecified: Secondary | ICD-10-CM

## 2015-05-30 DIAGNOSIS — I83893 Varicose veins of bilateral lower extremities with other complications: Secondary | ICD-10-CM

## 2015-05-30 DIAGNOSIS — R21 Rash and other nonspecific skin eruption: Secondary | ICD-10-CM | POA: Diagnosis not present

## 2015-05-30 LAB — GLUCOSE, POCT (MANUAL RESULT ENTRY): POC Glucose: 118 mg/dl — AB (ref 70–99)

## 2015-05-30 LAB — POCT GLYCOSYLATED HEMOGLOBIN (HGB A1C): Hemoglobin A1C: 9.5

## 2015-05-30 MED ORDER — LORATADINE 10 MG PO TABS: 10.0000 mg | ORAL_TABLET | Freq: Every day | ORAL | Status: DC

## 2015-05-30 MED ORDER — TRIAMCINOLONE ACETONIDE 0.1 % EX CREA: 1.0000 "application " | TOPICAL_CREAM | Freq: Two times a day (BID) | CUTANEOUS | Status: DC

## 2015-05-30 MED ORDER — IBUPROFEN 800 MG PO TABS: 800.0000 mg | ORAL_TABLET | Freq: Three times a day (TID) | ORAL | Status: DC | PRN

## 2015-05-30 NOTE — Progress Notes (Signed)
  Pt's here for a skin rash/hives on her left side. Pt requesting a referral to a dermatologist and cancer screening and mammogram specialist. She states her pain on her side at a level 8.Pt states that when she have hives she runs a fever. No fever reported today. Pt requesting  rx of pain meds and claritin. Pt has taken BP meds and all meds and all meds today.

## 2015-05-30 NOTE — Progress Notes (Unsigned)
ASSESSMENT: Pt currently experiencing symptoms of depression. Pt needs to f/u with PCP and [facility], and would benefit from psychoeducation and supportive counseling regarding coping with symptoms of depression.  Stage of Change: contemplative  PLAN: 1. F/U with behavioral health consultant in as needed 2. Psychiatric Medications: Zoloft. 3. Behavioral recommendation(s):   -Consider reading educational material (in [place]) regarding coping with symptoms of depression -Consider making an appointment with [facility] [place] Spanish-speaking counselors or coming back to talk to [employer] at [employer] SUBJECTIVE: Pt. referred by Dr Adrian Blackwater for symptoms of depression:  Pt. reports the following symptoms/concerns: Pt took PHQ9/GAD7, and marked that she is feeling "little interest or pleasure in doing things, feeling down, and (trouble with sleep) nearly every day", and agreed that it is helpful to talk about things that are bothering her.  Duration of problem: unknown Severity: moderate  OBJECTIVE: Orientation & Cognition: Oriented x3. Thought processes normal and appropriate to situation. Mood: appropriate. Affect: appropriate Appearance: appropriate Risk of harm to self or others: no risk of harm to self or others Substance use: none Assessments administered: PHQ9: 16/ GAD7: 10  Diagnosis: Depression CPT Code: F32.9 -------------------------------------------- Other(s) present in the room: Spanish interpreter  Time spent with patient in exam room: 10 minutes

## 2015-05-30 NOTE — Patient Instructions (Addendum)
Brenda Andrews was seen today for rash.  Diagnoses and all orders for this visit:  Uncontrolled type 2 diabetes mellitus with complication, unspecified long term insulin use status (HCC) -     POCT A1C -     Glucose (CBG)  Varicose veins of both lower extremities with complications  Skin rash -     Ambulatory referral to Dermatology -     triamcinolone cream (KENALOG) 0.1 %; Apply 1 application topically 2 (two) times daily. -     ibuprofen (ADVIL,MOTRIN) 800 MG tablet; Take 1 tablet (800 mg total) by mouth every 8 (eight) hours as needed.  Healthcare maintenance -     Flu Vaccine QUAD 36+ mos PF IM (Fluarix & Fluzone Quad PF) -     MM DIGITAL SCREENING BILATERAL; Future  Seasonal allergies -     loratadine (CLARITIN) 10 MG tablet; Take 1 tablet (10 mg total) by mouth daily.   F/u in 4-6 weeks for pap smear F/u in 3 months for rash and diabetes  Dr. Adrian Blackwater

## 2015-05-30 NOTE — Assessment & Plan Note (Signed)
DME order for compression stockings provided to patient to take to [employer] and [employer]

## 2015-05-30 NOTE — Progress Notes (Signed)
Patient ID: Brenda Andrews, female   DOB: [birth year], 69 y.o.   MRN: [id number]   Subjective:  Patient ID: Brenda Andrews, female    DOB: March 16, 1946  Age: 69 y.o. MRN: [id number] Tele spanish interpreter used   CC: Rash  HPI Brenda Andrews presents for   1. Rash: L sided rash. Persistent, chronic. Painful. Associated with subjective fever. Has not improved with antifungal cream.   2.  Calf pain: both calves. R >L. No trauma. Pain is not exacerbated by activity.   3. CHRONIC DIABETES  Disease Monitoring  Blood Sugar Ranges:   Polyuria: no   Visual problems: no   Medication Compliance: yes  Medication Side Effects  Hypoglycemia: no   Social History  Substance Use Topics  . Smoking status: Never Smoker   . Smokeless tobacco: Not on file  . Alcohol Use: No   Outpatient Prescriptions Prior to Visit  Medication Sig Dispense Refill  . ACCU-CHEK FASTCLIX LANCETS MISC Used 3 time per day/before meals and at bed time 102 each 11  . atorvastatin (LIPITOR) 40 MG tablet Take 1 tablet (40 mg total) by mouth daily. 90 tablet 3  . gabapentin (NEURONTIN) 300 MG capsule Take 1-2 capsules (300-600 mg total) by mouth at bedtime. 30 capsule 2  . glucose blood (ACCU-CHEK AVIVA PLUS) test strip 1 each by Other route 3 (three) times daily. E11.65 100 each 12  . insulin aspart protamine- aspart (NOVOLOG MIX 70/30) (70-30) 100 UNIT/ML injection Inject 0.3 mLs (30 Units total) into the skin 2 (two) times daily with a meal. 10 mL 11  . lisinopril (PRINIVIL,ZESTRIL) 5 MG tablet Take 1 tablet (5 mg total) by mouth daily. 30 tablet 3  . loratadine (CLARITIN) 10 MG tablet Take 10 mg by mouth daily.    Marland Kitchen NEEDLE, DISP, 30 G 30G X 1/2" MISC Use as directed for twice daily insulin injections 100 each 12  . sertraline (ZOLOFT) 50 MG tablet Take 50 mg by mouth daily.    Marland Kitchen terbinafine (LAMISIL) 250 MG tablet Take 1 tablet (250 mg total) by mouth daily. 56 tablet 0  . diazepam  (VALIUM) 2 MG tablet Take 1 pill every 8 hours only if needed for dizziness. (Patient not taking: Reported on 05/30/2015) 30 tablet 0  . ibuprofen (ADVIL,MOTRIN) 600 MG tablet Take 1 tablet (600 mg total) by mouth every 8 (eight) hours as needed. (Patient not taking: Reported on 05/30/2015) 30 tablet 0  . triamcinolone cream (KENALOG) 0.1 % Apply 1 application topically 2 (two) times daily. (Patient not taking: Reported on 05/30/2015) 45 g 0  . Vitamin D, Ergocalciferol, (DRISDOL) 50000 UNITS CAPS capsule Take 1 capsule (50,000 Units total) by mouth every 7 (seven) days. For 8 weeks (Patient not taking: Reported on 05/30/2015) 8 capsule 0   No facility-administered medications prior to visit.    ROS Review of Systems  Constitutional: Negative for fever and chills.  Eyes: Negative for visual disturbance.  Respiratory: Negative for shortness of breath.   Cardiovascular: Negative for chest pain.  Gastrointestinal: Negative for abdominal pain and blood in stool.  Musculoskeletal: Positive for myalgias. Negative for back pain and arthralgias.  Skin: Positive for rash.  Allergic/Immunologic: Negative for immunocompromised state.  Hematological: Negative for adenopathy. Does not bruise/bleed easily.  Psychiatric/Behavioral: Negative for suicidal ideas and dysphoric mood.    Objective:  BP 96/59 mmHg  Pulse 78  Temp(Src) 98.6 F (37 C) (Oral)  Resp 18  Ht 5' (1.524 m)  Wt 138 lb (62.596 kg)  BMI 26.95 kg/m2  SpO2 97%  BP/Weight 05/30/2015 03/10/2015 8/67/6195  Systolic BP 96 093 267  Diastolic BP 59 68 59  Wt. (Lbs) 138 - 149  BMI 26.95 - 28.17    Physical Exam  Constitutional: She is oriented to person, place, and time. She appears well-developed and well-nourished. No distress.  HENT:  Head: Normocephalic and atraumatic.  Cardiovascular: Normal rate, regular rhythm, normal heart sounds and intact distal pulses.   Pulmonary/Chest: Effort normal and breath sounds normal.    Musculoskeletal: She exhibits no edema.       Right lower leg: She exhibits tenderness. She exhibits no bony tenderness, no swelling, no edema, no deformity and no laceration.       Left lower leg: She exhibits no tenderness, no bony tenderness, no swelling, no edema, no deformity and no laceration.  Dilated varicose veins R calf   Neurological: She is alert and oriented to person, place, and time.  Skin: Skin is warm and dry. Rash noted.  Hyperpigmented papules on b/l flanks and under both breast   Psychiatric: She has a normal mood and affect.   CBG 118 A1c 9.5   Assessment & Plan:   Problem List Items Addressed This Visit    DM (diabetes mellitus), type 2, uncontrolled ([place]) - Primary (Chronic)   Relevant Orders   POCT A1C (Completed)   Glucose (CBG) (Completed)   Healthcare maintenance   Relevant Orders   Flu Vaccine QUAD 36+ mos PF IM (Fluarix & Fluzone Quad PF) (Completed)   MM DIGITAL SCREENING BILATERAL   Seasonal allergies   Relevant Medications   loratadine (CLARITIN) 10 MG tablet   Skin rash   Relevant Medications   triamcinolone cream (KENALOG) 0.1 %   ibuprofen (ADVIL,MOTRIN) 800 MG tablet   Other Relevant Orders   Ambulatory referral to Dermatology   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    Follow-up: No Follow-up on file.   Boykin Nearing MD

## 2015-06-06 ENCOUNTER — Other Ambulatory Visit: Payer: Self-pay | Admitting: Family Medicine

## 2015-06-06 DIAGNOSIS — Z1231 Encounter for screening mammogram for malignant neoplasm of breast: Secondary | ICD-10-CM

## 2015-06-13 ENCOUNTER — Other Ambulatory Visit: Payer: Self-pay | Admitting: Family Medicine

## 2015-06-15 ENCOUNTER — Ambulatory Visit
Admission: RE | Admit: 2015-06-15 | Discharge: 2015-06-15 | Disposition: A | Discharge status: Home / Self Care | Payer: Medicaid Other | Source: Ambulatory Visit | Attending: Family Medicine | Admitting: Family Medicine

## 2015-06-15 DIAGNOSIS — Z1231 Encounter for screening mammogram for malignant neoplasm of breast: Secondary | ICD-10-CM

## 2015-06-28 NOTE — Telephone Encounter (Signed)
Pt. Niece called requesting a med refill on Gabapentin. Pt. Would like the prescription sent to [facility] on [address]. Please f/u with pt.

## 2015-06-29 ENCOUNTER — Other Ambulatory Visit: Payer: Self-pay | Admitting: Family Medicine

## 2015-06-29 ENCOUNTER — Telehealth: Payer: Self-pay | Admitting: *Deleted

## 2015-06-29 ENCOUNTER — Encounter: Payer: Self-pay | Admitting: Podiatry

## 2015-06-29 ENCOUNTER — Ambulatory Visit (INDEPENDENT_AMBULATORY_CARE_PROVIDER_SITE_OTHER): Payer: Medicaid Other | Admitting: Podiatry

## 2015-06-29 VITALS — BP 99/88 | HR 67 | Resp 12

## 2015-06-29 DIAGNOSIS — R0989 Other specified symptoms and signs involving the circulatory and respiratory systems: Secondary | ICD-10-CM | POA: Diagnosis not present

## 2015-06-29 DIAGNOSIS — M79676 Pain in unspecified toe(s): Secondary | ICD-10-CM | POA: Diagnosis not present

## 2015-06-29 DIAGNOSIS — L600 Ingrowing nail: Secondary | ICD-10-CM

## 2015-06-29 DIAGNOSIS — B351 Tinea unguium: Secondary | ICD-10-CM

## 2015-06-29 NOTE — Progress Notes (Signed)
   Subjective:    Patient ID: Brenda Andrews, female    DOB: 1946-03-03, 69 y.o.   MRN: [id number]  HPI    This patient presents today stating that she has had approximately 15 years of discomfort of the hallux nails that she says is gradually worsened over time. He says that the nails and been trimmed out on a regular basis to release symptoms. She denies any history of infection in the nails her chart review reveals that she is taking terbinafine 250 mg. There is some mention of topical cream to the nail margins.  Patient is diabetic and denies history of ulceration, claudication or amputation  Review of Systems  Gastrointestinal: Positive for diarrhea.       Objective:   Physical Exam  Patient is Spanish-speaking and has a Spanish interpreter in the room interpreting for her as well as a personal friend who is in the room Patient appears orientated 3  Vascular: No peripheral edema bilaterally DP pulses 2/4 bilaterally PT pulses 0/4 bilaterally Capillary reflex immediate bile at  Neurological: Sensation4/5 bilaterally Vibratory sensation nonreactive bilaterally Ankle reflexes reactive bilaterally  Dermatological: No skin lesions bilaterally The hallux nail margins appear incurvated without erythema, edema or drainage The medial lateral margins are mildly tender to palpation The hallux nails are mildly dystrophic  Musculoskeletal: No deformities noted bilaterally There is no restriction ankle, subtalar, midtarsal joints bilaterally      Assessment & Plan:   Assessment: Absent PT pulses bilaterally evaluate with arterial Doppler with ABIs and TBI is Protective sensation intact Diabetic peripheral neuropathy Ingrowing medial and lateral borders of the hallux bilaterally Mild dystrophic hallux nails may be consistent with mycotic nails  Plan: Today review the results of exam with patient and interpreter). I informed that I want to refer them to the  vascular lab for arterial Doppler. Also, patient has history of poorly controlled diabetes and will not recommend aggressive nail surgery at this time. Patient was complaining of discomfort along the margins of the hallux nails. Today I debrided the medial lateral borders the right and left hallux toenails. There was slight bleeding in the lateral left hallux nail margin after debridement. Antibiotic ointment was applied and and a Band-Aid. Patient and friend and turgor were advised to continue applying topical antibiotic ointment to this area daily until a scab forms  Notify patient of results of vascular lab

## 2015-06-29 NOTE — Telephone Encounter (Addendum)
Faxed orders to [facility].  PRIOR AUTHORIZATION THROUGH [facility] - "NOTE:  THIS [place] MEDICAID MEMBER DOES NOT REQUIRE PRIOR AUTHORIZATION FOR OUTPATIENT RADIOLOGY THROUGH [facility] OR [place] [facility] AT THIS TIME."

## 2015-06-29 NOTE — Patient Instructions (Addendum)
Apply triple antiobiotic oitment to the left big toenail daily until the scab form.  Diabetes and Foot Care Diabetes may cause you to have problems because of poor blood supply (circulation) to your feet and legs. This may cause the skin on your feet to become thinner, break easier, and heal more slowly. Your skin may become dry, and the skin may peel and crack. You may also have nerve damage in your legs and feet causing decreased feeling in them. You may not notice minor injuries to your feet that could lead to infections or more serious problems. Taking care of your feet is one of the most important things you can do for yourself.  HOME CARE INSTRUCTIONS  Wear shoes at all times, even in the house. Do not go barefoot. Bare feet are easily injured.  Check your feet daily for blisters, cuts, and redness. If you cannot see the bottom of your feet, use a mirror or ask someone for help.  Wash your feet with warm water (do not use hot water) and mild soap. Then pat your feet and the areas between your toes until they are completely dry. Do not soak your feet as this can dry your skin.  Apply a moisturizing lotion or petroleum jelly (that does not contain alcohol and is unscented) to the skin on your feet and to dry, brittle toenails. Do not apply lotion between your toes.  Trim your toenails straight across. Do not dig under them or around the cuticle. File the edges of your nails with an emery board or nail file.  Do not cut corns or calluses or try to remove them with medicine.  Wear clean socks or stockings every day. Make sure they are not too tight. Do not wear knee-high stockings since they may decrease blood flow to your legs.  Wear shoes that fit properly and have enough cushioning. To break in new shoes, wear them for just a few hours a day. This prevents you from injuring your feet. Always look in your shoes before you put them on to be sure there are no objects inside.  Do not cross  your legs. This may decrease the blood flow to your feet.  If you find a minor scrape, cut, or break in the skin on your feet, keep it and the skin around it clean and dry. These areas may be cleansed with mild soap and water. Do not cleanse the area with peroxide, alcohol, or iodine.  When you remove an adhesive bandage, be sure not to damage the skin around it.  If you have a wound, look at it several times a day to make sure it is healing.  Do not use heating pads or hot water bottles. They may burn your skin. If you have lost feeling in your feet or legs, you may not know it is happening until it is too late.  Make sure your health care provider performs a complete foot exam at least annually or more often if you have foot problems. Report any cuts, sores, or bruises to your health care provider immediately. SEEK MEDICAL CARE IF:   You have an injury that is not healing.  You have cuts or breaks in the skin.  You have an ingrown nail.  You notice redness on your legs or feet.  You feel burning or tingling in your legs or feet.  You have pain or cramps in your legs and feet.  Your legs or feet are numb.  Your  feet always feel cold. SEEK IMMEDIATE MEDICAL CARE IF:   There is increasing redness, swelling, or pain in or around a wound.  There is a red line that goes up your leg.  Pus is coming from a wound.  You develop a fever or as directed by your health care provider.  You notice a bad smell coming from an ulcer or wound.   This information is not intended to replace advice given to you by your health care provider. Make sure you discuss any questions you have with your health care provider.   Document Released: 08/10/2000 Document Revised: 04/15/2013 Document Reviewed: 01/20/2013 Elsevier Interactive Patient Education [employer].

## 2015-06-30 ENCOUNTER — Other Ambulatory Visit: Payer: Self-pay

## 2015-06-30 DIAGNOSIS — E119 Type 2 diabetes mellitus without complications: Secondary | ICD-10-CM

## 2015-06-30 MED ORDER — GABAPENTIN 300 MG PO CAPS: 300.0000 mg | ORAL_CAPSULE | Freq: Every day | ORAL | Status: DC

## 2015-06-30 NOTE — Telephone Encounter (Signed)
Patient called and requested a med refill for Gabapentin. Please f/u

## 2015-06-30 NOTE — Telephone Encounter (Signed)
Raquel called and left message for nurse explaining patient needs gabapentin refilled.  Nurse called patient, reached voicemail. Left message for patient to call Tamantha Saline with [facility], at [phone]. Nurse called to make patient aware of prescription refill sent to pharmacy.

## 2015-06-30 NOTE — Telephone Encounter (Signed)
Raquel returned call to nurse and is aware of medication being sent to pharmacy.

## 2015-07-07 ENCOUNTER — Other Ambulatory Visit: Payer: Self-pay | Admitting: Podiatry

## 2015-07-07 DIAGNOSIS — R0989 Other specified symptoms and signs involving the circulatory and respiratory systems: Secondary | ICD-10-CM

## 2015-07-13 ENCOUNTER — Ambulatory Visit (HOSPITAL_BASED_OUTPATIENT_CLINIC_OR_DEPARTMENT_OTHER)
Admission: RE | Admit: 2015-07-13 | Discharge: 2015-07-13 | Disposition: A | Discharge status: Home / Self Care | Payer: Medicaid Other | Source: Ambulatory Visit

## 2015-07-13 ENCOUNTER — Ambulatory Visit (HOSPITAL_COMMUNITY)
Admission: RE | Admit: 2015-07-13 | Discharge: 2015-07-13 | Disposition: A | Discharge status: Home / Self Care | Payer: Medicaid Other | Source: Ambulatory Visit | Attending: Cardiovascular Disease | Admitting: Cardiovascular Disease

## 2015-07-13 DIAGNOSIS — E119 Type 2 diabetes mellitus without complications: Secondary | ICD-10-CM | POA: Insufficient documentation

## 2015-07-13 DIAGNOSIS — R0989 Other specified symptoms and signs involving the circulatory and respiratory systems: Secondary | ICD-10-CM

## 2015-07-13 DIAGNOSIS — E785 Hyperlipidemia, unspecified: Secondary | ICD-10-CM | POA: Diagnosis not present

## 2015-07-20 ENCOUNTER — Telehealth: Payer: Self-pay | Admitting: *Deleted

## 2015-07-20 NOTE — Telephone Encounter (Addendum)
-----   Message from Gean Birchwood, DPM sent at 07/20/2015  7:57 AM EST ----- Lower extremity arterial Doppler dated 07/13/2015   Normal ABIs bilaterally Normal great toe brachial index bilaterally Notify patient that the vascular lab was within normal limits and no follow-up needed.  Recommendations called to pt's caregiver Rosetta Posner.

## 2015-09-05 ENCOUNTER — Ambulatory Visit: Payer: Medicaid Other | Admitting: Family Medicine

## 2015-10-04 ENCOUNTER — Other Ambulatory Visit: Payer: Self-pay | Admitting: Family Medicine

## 2015-11-04 ENCOUNTER — Other Ambulatory Visit: Payer: Self-pay | Admitting: Family Medicine

## 2015-11-04 ENCOUNTER — Telehealth: Payer: Self-pay | Admitting: Family Medicine

## 2015-11-04 ENCOUNTER — Encounter: Payer: Self-pay | Admitting: Family Medicine

## 2015-11-04 ENCOUNTER — Ambulatory Visit: Payer: Medicaid Other | Attending: Family Medicine | Admitting: Family Medicine

## 2015-11-04 VITALS — BP 102/62 | HR 81 | Temp 97.8°F | Resp 18 | Ht 60.0 in | Wt 138.0 lb

## 2015-11-04 DIAGNOSIS — E118 Type 2 diabetes mellitus with unspecified complications: Secondary | ICD-10-CM | POA: Diagnosis not present

## 2015-11-04 DIAGNOSIS — Z1159 Encounter for screening for other viral diseases: Secondary | ICD-10-CM

## 2015-11-04 DIAGNOSIS — L0202 Furuncle of face: Secondary | ICD-10-CM | POA: Diagnosis not present

## 2015-11-04 DIAGNOSIS — IMO0002 Reserved for concepts with insufficient information to code with codable children: Secondary | ICD-10-CM

## 2015-11-04 DIAGNOSIS — Z794 Long term (current) use of insulin: Secondary | ICD-10-CM | POA: Insufficient documentation

## 2015-11-04 DIAGNOSIS — Z79899 Other long term (current) drug therapy: Secondary | ICD-10-CM | POA: Diagnosis not present

## 2015-11-04 DIAGNOSIS — R21 Rash and other nonspecific skin eruption: Secondary | ICD-10-CM | POA: Insufficient documentation

## 2015-11-04 DIAGNOSIS — E1165 Type 2 diabetes mellitus with hyperglycemia: Secondary | ICD-10-CM | POA: Diagnosis not present

## 2015-11-04 DIAGNOSIS — B0089 Other herpesviral infection: Secondary | ICD-10-CM

## 2015-11-04 LAB — COMPLETE METABOLIC PANEL WITH GFR
ALBUMIN: 3.6 g/dL (ref 3.6–5.1)
ALK PHOS: 135 U/L — AB (ref 33–130)
ALT: 14 U/L (ref 6–29)
AST: 13 U/L (ref 10–35)
BILIRUBIN TOTAL: 0.5 mg/dL (ref 0.2–1.2)
BUN: 38 mg/dL — AB (ref 7–25)
CALCIUM: 9.3 mg/dL (ref 8.6–10.4)
CO2: 23 mmol/L (ref 20–31)
Chloride: 100 mmol/L (ref 98–110)
Creat: 1.64 mg/dL — ABNORMAL HIGH (ref 0.60–0.93)
GFR, EST NON AFRICAN AMERICAN: 31 mL/min — AB (ref >=60)
GFR, Est African American: 36 mL/min — ABNORMAL LOW (ref >=60)
GLUCOSE: 246 mg/dL — AB (ref 65–99)
POTASSIUM: 5.3 mmol/L (ref 3.5–5.3)
SODIUM: 134 mmol/L — AB (ref 135–146)
TOTAL PROTEIN: 7.3 g/dL (ref 6.1–8.1)

## 2015-11-04 LAB — CBC
HEMATOCRIT: 32.8 % — AB (ref 36.0–46.0)
HEMOGLOBIN: 11 g/dL — AB (ref 12.0–15.0)
MCH: 30.8 pg (ref 26.0–34.0)
MCHC: 33.5 g/dL (ref 30.0–36.0)
MCV: 91.9 fL (ref 78.0–100.0)
MPV: 10.5 fL (ref 8.6–12.4)
Platelets: 216 10*3/uL (ref 150–400)
RBC: 3.57 MIL/uL — AB (ref 3.87–5.11)
RDW: 13.2 % (ref 11.5–15.5)
WBC: 6.4 10*3/uL (ref 4.0–10.5)

## 2015-11-04 LAB — GLUCOSE, POCT (MANUAL RESULT ENTRY): POC Glucose: 251 mg/dl — AB (ref 70–99)

## 2015-11-04 LAB — POCT GLYCOSYLATED HEMOGLOBIN (HGB A1C): Hemoglobin A1C: 11.5

## 2015-11-04 MED ORDER — INSULIN ASPART PROT & ASPART (70-30 MIX) 100 UNIT/ML ~~LOC~~ SUSP: 35.0000 [IU] | Freq: Two times a day (BID) | SUBCUTANEOUS | Status: DC

## 2015-11-04 MED ORDER — CLINDAMYCIN HCL 300 MG PO CAPS: 300.0000 mg | ORAL_CAPSULE | Freq: Three times a day (TID) | ORAL | Status: DC

## 2015-11-04 NOTE — Progress Notes (Signed)
Subjective:  Patient ID: Brenda Andrews, female    DOB: Nov 06, 1945  Age: 70 y.o. MRN: [id number]  Spanish interpreter used   CC: Diabetes and Rash   HPI Brenda Andrews presents for    1. CHRONIC DIABETES  Disease Monitoring  Blood Sugar Ranges: not checking   Polyuria: no   Visual problems: no   Medication Compliance: yes  Medication Side Effects  Hypoglycemia: no   Preventitive Health Care  Eye Exam: due   Foot Exam: done today   2. Rash: x 4 years. On L side under breast and L upper back/flank. Also with L forehead boil. She went to dermatology. Was tried on doxycycline x 2 which resulted in diarrhea. Request to be tested for viruses. Request referral to, "blood specialist".   Social History  Substance Use Topics  . Smoking status: Never Smoker   . Smokeless tobacco: Not on file  . Alcohol Use: No    Outpatient Prescriptions Prior to Visit  Medication Sig Dispense Refill  . ACCU-CHEK FASTCLIX LANCETS MISC Used 3 time per day/before meals and at bed time 102 each 11  . atorvastatin (LIPITOR) 40 MG tablet Take 1 tablet (40 mg total) by mouth daily. 90 tablet 3  . gabapentin (NEURONTIN) 300 MG capsule Take 1 to 2 capsules by mouth at bedtime. Must have office visit for refills 30 capsule 0  . glucose blood (ACCU-CHEK AVIVA PLUS) test strip 1 each by Other route 3 (three) times daily. E11.65 100 each 12  . ibuprofen (ADVIL,MOTRIN) 800 MG tablet Take 1 tablet (800 mg total) by mouth every 8 (eight) hours as needed. Must have office visit for refills 30 tablet 0  . insulin aspart protamine- aspart (NOVOLOG MIX 70/30) (70-30) 100 UNIT/ML injection Inject 0.3 mLs (30 Units total) into the skin 2 (two) times daily with a meal. 10 mL 11  . lisinopril (PRINIVIL,ZESTRIL) 10 MG tablet TAKE 1 TABLET BY MOUTH DAILY 30 tablet 0  . lisinopril (PRINIVIL,ZESTRIL) 5 MG tablet Take 1 tablet (5 mg total) by mouth daily. (Patient not taking: Reported on 11/04/2015)  30 tablet 3  . loratadine (CLARITIN) 10 MG tablet Take 1 tablet (10 mg total) by mouth daily. (Patient not taking: Reported on 11/04/2015) 30 tablet 5  . NEEDLE, DISP, 30 G 30G X 1/2" MISC Use as directed for twice daily insulin injections (Patient not taking: Reported on 11/04/2015) 100 each 12  . sertraline (ZOLOFT) 50 MG tablet Take 50 mg by mouth daily. Reported on 11/04/2015    . terbinafine (LAMISIL) 250 MG tablet Take 1 tablet (250 mg total) by mouth daily. (Patient not taking: Reported on 11/04/2015) 56 tablet 0  . triamcinolone cream (KENALOG) 0.1 % Apply 1 application topically 2 (two) times daily. (Patient not taking: Reported on 11/04/2015) 45 g 0   No facility-administered medications prior to visit.    ROS Review of Systems  Constitutional: Negative for fever and chills.  Eyes: Negative for visual disturbance.  Respiratory: Negative for shortness of breath.   Cardiovascular: Negative for chest pain.  Gastrointestinal: Negative for abdominal pain and blood in stool.  Musculoskeletal: Positive for myalgias. Negative for back pain and arthralgias.  Skin: Positive for rash.  Allergic/Immunologic: Negative for immunocompromised state.  Hematological: Negative for adenopathy. Does not bruise/bleed easily.  Psychiatric/Behavioral: Negative for suicidal ideas and dysphoric mood.    Objective:  BP 102/62 mmHg  Pulse 81  Temp(Src) 97.8 F (36.6 C) (Oral)  Resp 18  Ht 5' (1.524 m)  Wt 138 lb (62.596 kg)  BMI 26.95 kg/m2  SpO2 98%  BP/Weight 11/04/2015 06/29/2015 0000000  Systolic BP [id number] 99 96  Diastolic BP 62 88 59  Wt. (Lbs) 138 - 138  BMI 26.95 - 26.95   Physical Exam  Constitutional: She is oriented to person, place, and time. She appears well-developed and well-nourished. No distress.  HENT:  Head: Normocephalic and atraumatic.  Cardiovascular: Normal rate, regular rhythm, normal heart sounds and intact distal pulses.   Pulmonary/Chest: Effort normal and breath sounds  normal.  Musculoskeletal: She exhibits no edema.       Right lower leg: She exhibits tenderness. She exhibits no bony tenderness, no swelling, no edema, no deformity and no laceration.       Left lower leg: She exhibits no tenderness, no bony tenderness, no swelling, no edema, no deformity and no laceration.  Dilated varicose veins R calf   Neurological: She is alert and oriented to person, place, and time.  Skin: Skin is warm and dry. Rash noted.     Hyperpigmented papules on b/l flanks and under both breast   Psychiatric: She has a normal mood and affect.    Lab Results  Component Value Date   HGBA1C 11.5 11/04/2015   CBG 251  Lab Results  Component Value Date   HGBA1C 11.5 11/04/2015    Assessment & Plan:   Brenda Andrews was seen today for diabetes and rash.  Diagnoses and all orders for this visit:  Uncontrolled type 2 diabetes mellitus with complication, with long-term current use of insulin (HCC) -     HgB A1c -     Glucose (CBG) -     insulin aspart protamine- aspart (NOVOLOG MIX 70/30) (70-30) 100 UNIT/ML injection; Inject 0.35 mLs (35 Units total) into the skin 2 (two) times daily with a meal.  Skin rash -     CBC -     COMPLETE METABOLIC PANEL WITH GFR -     HSV(herpes smplx)abs-1+2(IgG+IgM)-bld -     clindamycin (CLEOCIN) 300 MG capsule; Take 1 capsule (300 mg total) by mouth 3 (three) times daily.  Need for hepatitis C screening test -     Hepatitis C antibody, reflex   No orders of the defined types were placed in this encounter.    Follow-up: No Follow-up on file.   Boykin Nearing MD

## 2015-11-04 NOTE — Progress Notes (Signed)
F/U DM Pt requesting CBC blood work  Stated continue with Rash on body and head  No pain today  No tobacco user  No suicidal thoughts in the past two weeks

## 2015-11-04 NOTE — Assessment & Plan Note (Signed)
A: uncontrolled diabetes P: Increase novolog 70/30 to 35 U BID

## 2015-11-04 NOTE — Patient Instructions (Signed)
Brenda Andrews was seen today for diabetes and rash.  Diagnoses and all orders for this visit:  Uncontrolled type 2 diabetes mellitus with complication, with long-term current use of insulin (HCC) -     HgB A1c -     Glucose (CBG) -     insulin aspart protamine- aspart (NOVOLOG MIX 70/30) (70-30) 100 UNIT/ML injection; Inject 0.35 mLs (35 Units total) into the skin 2 (two) times daily with a meal.  Skin rash -     CBC -     COMPLETE METABOLIC PANEL WITH GFR -     HSV(herpes smplx)abs-1+2(IgG+IgM)-bld -     clindamycin (CLEOCIN) 300 MG capsule; Take 1 capsule (300 mg total) by mouth 3 (three) times daily.  Need for hepatitis C screening test -     Hepatitis C antibody, reflex   Increase insulin to 35 U twice daily  Please also schedule dermatology follow up F/u in 4 weeks for skin rash  Dr. Adrian Blackwater

## 2015-11-04 NOTE — Assessment & Plan Note (Signed)
A: chronic rash that is both vesicular and papular. Patient with sulf allergy and intolerant of doxycyline P: Clindamycin HSV titers, CMP, CBC  Likely follow clinda with acyclovir

## 2015-11-04 NOTE — Telephone Encounter (Signed)
Patient dropped paperwork off to be completed by PCP....please call patient when paperwork is ready

## 2015-11-05 LAB — HEPATITIS C ANTIBODY: HCV AB: NEGATIVE

## 2015-11-06 DIAGNOSIS — Z792 Long term (current) use of antibiotics: Secondary | ICD-10-CM | POA: Diagnosis not present

## 2015-11-06 DIAGNOSIS — E119 Type 2 diabetes mellitus without complications: Secondary | ICD-10-CM | POA: Diagnosis not present

## 2015-11-06 DIAGNOSIS — Z79899 Other long term (current) drug therapy: Secondary | ICD-10-CM | POA: Diagnosis not present

## 2015-11-06 DIAGNOSIS — L02211 Cutaneous abscess of abdominal wall: Secondary | ICD-10-CM | POA: Insufficient documentation

## 2015-11-06 DIAGNOSIS — L02811 Cutaneous abscess of head [any part, except face]: Secondary | ICD-10-CM | POA: Insufficient documentation

## 2015-11-06 DIAGNOSIS — N189 Chronic kidney disease, unspecified: Secondary | ICD-10-CM | POA: Insufficient documentation

## 2015-11-06 DIAGNOSIS — L02212 Cutaneous abscess of back [any part, except buttock]: Secondary | ICD-10-CM | POA: Diagnosis not present

## 2015-11-06 DIAGNOSIS — L0390 Cellulitis, unspecified: Secondary | ICD-10-CM | POA: Diagnosis not present

## 2015-11-06 DIAGNOSIS — Z8719 Personal history of other diseases of the digestive system: Secondary | ICD-10-CM | POA: Insufficient documentation

## 2015-11-06 DIAGNOSIS — E785 Hyperlipidemia, unspecified: Secondary | ICD-10-CM | POA: Insufficient documentation

## 2015-11-07 ENCOUNTER — Encounter (HOSPITAL_COMMUNITY): Payer: Self-pay | Admitting: Emergency Medicine

## 2015-11-07 ENCOUNTER — Emergency Department (HOSPITAL_COMMUNITY)
Admission: EM | Admit: 2015-11-07 | Discharge: 2015-11-07 | Disposition: A | Discharge status: Home / Self Care | Payer: Medicaid Other | Attending: Emergency Medicine | Admitting: Emergency Medicine

## 2015-11-07 DIAGNOSIS — L0291 Cutaneous abscess, unspecified: Secondary | ICD-10-CM

## 2015-11-07 DIAGNOSIS — L0390 Cellulitis, unspecified: Secondary | ICD-10-CM

## 2015-11-07 MED ORDER — IBUPROFEN 800 MG PO TABS
800.0000 mg | ORAL_TABLET | Freq: Once | ORAL | Status: AC
  Administered 2015-11-07: 800 mg via ORAL
  Filled 2015-11-07: qty 1

## 2015-11-07 NOTE — ED Provider Notes (Signed)
CSN: [id number]     Arrival date & time 11/06/15  2354 History   First MD Initiated Contact with Patient 11/07/15 0536     Chief Complaint  Patient presents with  . Abscess  . Fever     (Consider location/radiation/quality/duration/timing/severity/associated sxs/prior Treatment) Patient is a 70 y.o. female presenting with abscess. The history is provided by the patient and a relative. The history is limited by a language barrier.  Abscess Abscess location: back bottom abdominal wall scalp. Abscess quality: draining and painful   Red streaking: no   Duration: years on a regular basis.   has seen PMD and derm and placed on multiple abx but still they recur. Progression:  Unchanged Pain details:    Quality:  Dull   Severity:  Moderate   Timing:  Constant   Progression:  Unchanged Chronicity:  Chronic Context: diabetes   Relieved by:  Nothing Worsened by:  Nothing tried Ineffective treatments:  Oral antibiotics Associated symptoms: no fever   Risk factors: prior abscess   feels feverish right now but temp is normal  Past Medical History  Diagnosis Date  . IBS (irritable bowel syndrome)   . Diabetes mellitus 1982  . Hyperlipidemia 2013  . Chronic kidney disease 2002  . COLITIS 03/19/2008    Qualifier: Diagnosis of  By: Marland Mcalpine     History reviewed. No pertinent past surgical history. Family History  Problem Relation Age of Onset  . Diabetes Brother   . Diabetes Brother   . Diabetes Brother   . Diabetes Brother    Social History  Substance Use Topics  . Smoking status: Never Smoker   . Smokeless tobacco: None  . Alcohol Use: No   OB History    Gravida Para Term Preterm AB TAB SAB Ectopic Multiple Living   7 7 7       7      Review of Systems  Constitutional: Negative for fever.  All other systems reviewed and are negative.     Allergies  Pollen extract-tree extract and Sulfonamide derivatives  Home Medications   Prior to Admission  medications   Medication Sig Start Date End Date Taking? Authorizing Provider  ACCU-CHEK FASTCLIX LANCETS MISC Used 3 time per day/before meals and at bed time 04/28/15   Boykin Nearing, MD  atorvastatin (LIPITOR) 40 MG tablet Take 1 tablet (40 mg total) by mouth daily. 09/20/14   Josalyn Funches, MD  clindamycin (CLEOCIN) 300 MG capsule Take 1 capsule (300 mg total) by mouth 3 (three) times daily. 11/04/15   Josalyn Funches, MD  gabapentin (NEURONTIN) 300 MG capsule Take 1 capsule (300 mg total) by mouth at bedtime. 11/04/15   Josalyn Funches, MD  glucose blood (ACCU-CHEK AVIVA PLUS) test strip 1 each by Other route 3 (three) times daily. E11.65 03/10/15   Josalyn Funches, MD  ibuprofen (ADVIL,MOTRIN) 800 MG tablet Take 1 tablet (800 mg total) by mouth every 8 (eight) hours as needed. Must have office visit for refills 10/05/15   Boykin Nearing, MD  insulin aspart protamine- aspart (NOVOLOG MIX 70/30) (70-30) 100 UNIT/ML injection Inject 0.35 mLs (35 Units total) into the skin 2 (two) times daily with a meal. 11/04/15   Josalyn Funches, MD  lisinopril (PRINIVIL,ZESTRIL) 10 MG tablet TAKE 1 TABLET BY MOUTH DAILY 08/02/15   Josalyn Funches, MD  NEEDLE, DISP, 30 G 30G X 1/2" MISC Use as directed for twice daily insulin injections Patient not taking: Reported on 11/04/2015 03/30/15   Boykin Nearing, MD  sertraline (ZOLOFT) 50 MG tablet Take 50 mg by mouth daily. Reported on 11/04/2015    Historical Provider, MD   BP 100/46 mmHg  Pulse 80  Temp(Src) 98 F (36.7 C) (Oral)  Resp 18  Wt 134 lb 7 oz (60.98 kg)  SpO2 100% Physical Exam  Constitutional: She is oriented to person, place, and time. She appears well-developed and well-nourished. No distress.  HENT:  Head: Normocephalic and atraumatic.  Mouth/Throat: Oropharynx is clear and moist.  Eyes: Conjunctivae are normal. Pupils are equal, round, and reactive to light.  Neck: Normal range of motion. Neck supple.  Cardiovascular: Normal rate, regular rhythm  and intact distal pulses.   Pulmonary/Chest: Effort normal and breath sounds normal. No respiratory distress. She has no wheezes. She has no rales.  Abdominal: Soft. Bowel sounds are normal. There is no tenderness. There is no rebound and no guarding.  Musculoskeletal: Normal range of motion.  Neurological: She is alert and oriented to person, place, and time.  Skin:     Psychiatric: She has a normal mood and affect.    ED Course  Procedures (including critical care time) Labs Review Labs Reviewed - No data to display  Imaging Review No results found. I have personally reviewed and evaluated these images and lab results as part of my medical decision-making.   EKG Interpretation None      MDM   Final diagnoses:  Abscess and cellulitis    Is on day 3 of 7 of clindamycin.  Ibuprofen alternating with tylenol for pain.  No body lotions no bath gels.  Use stringent on the skin post shower.  Wear loose fitting white cotton t shirts and boxer shorts when possible to wick moisture away from the skin and follow up with your family doctor for ongoing care.  Patient and family verbalize understanding and agree to follow up    Meli Faley, MD 11/07/15 [phone]

## 2015-11-07 NOTE — ED Notes (Signed)
C/o abscess on head (just behind L ear) and "skin infections/bumps" on different parts of body for 2-3 months.  Reports fever, chills, and body aches x 3 days.

## 2015-11-07 NOTE — Discharge Instructions (Signed)
Absceso (Abscess)  Un absceso es una zona infectada que contiene pus y desechos.Puede aparecer en cualquier parte del cuerpo. Tambin se lo conoce como fornculo o divieso. CAUSAS  Ocurre cuando los tejidos se infectan. Tambin puede formarse por obstruccin de las glndulas sebceas o las glndulas sudorparas, infeccin de los folculos pilosos o por una lesin pequea en la piel. A medida que el organismo lucha contra la infeccin, se acumula pus en la zona y hace presin debajo de la piel. Esta presin causa dolor. Las personas con un sistema inmunolgico debilitado tienen dificultad para Pension scheme manager las infecciones y pueden formar abscesos con ms frecuencia.  SNTOMAS  Generalmente un absceso se forma sobre la piel y se vuelve una masa dolorosa, roja, caliente y sensible. Si se forma debajo de la piel, podr sentir como una zona blanda, que se [place], debajo de la piel. Algunos abscesos se abren (ruptura) por s mismos, pero la mayora seguir empeorando si no se lo trata. La infeccin puede diseminarse hacia otros sitios del cuerpo y finalmente al torrente sanguneo y hace que el enfermo se sienta mal.  DIAGNSTICO  El mdico le har una historia clnica y un examen fsico. Podrn tomarle Truddie Coco de lquido del absceso y Web designer para Pension scheme manager la causa de la infeccin. .  TRATAMIENTO  El mdico le indicar antibiticos para combatir la infeccin. Sin embargo, el uso de antibiticos solamente no curar el absceso. El mdico tendr que hacer un pequeo corte (incisin) en el absceso para drenar el pus. En algunos casos se introduce una gasa en el absceso para reducir Conservation officer, historic buildings y que siga drenando la zona.  INSTRUCCIONES PARA EL CUIDADO EN EL HOGAR   Solo tome medicamentos de venta libre o recetados para Conservation officer, historic buildings, Tree surgeon o fiebre, segn las indicaciones del mdico.  Si le han recetado antibiticos, tmelos segn las indicaciones. Tmelos todos, aunque se sienta mejor.  Si le aplicaron  una gasa, siga las indicaciones del mdico para [place].  Para evitar la propagacin de la infeccin:  Mantenga el absceso cubierto con el vendaje.  Lvese bien las manos.  No comparta artculos de cuidado personal, toallas o jacuzzis con los dems.  Evite el contacto con la piel de Producer, television/film/video.  Mantenga la piel y la ropa limpia alrededor del absceso.  Cumpla con todas las visitas de control, segn le indique su mdico. SOLICITE ATENCIN MDICA SI:   Aumenta el dolor, la hinchazn, el enrojecimiento, drena lquido o sangra.  Siente dolores musculares, escalofros, o una sensacin general de Tree surgeon.  Tiene fiebre. ASEGRESE DE QUE:   Comprende estas instrucciones.  Controlar su enfermedad.  Solicitar ayuda de inmediato si no mejora o si empeora.   Esta informacin no tiene Marine scientist el consejo del mdico. Asegrese de hacerle al mdico cualquier pregunta que tenga.   Document Released: 08/13/2005 Document Revised: 02/12/2012 Elsevier Interactive Patient Education 2016 [place] (Cellulitis) La celulitis es una infeccin de la piel y del tejido subyacente. El rea infectada generalmente est de color rojo y duele. Ocurre con ms frecuencia en los brazos y en las piernas.  CAUSAS  La causa es una bacteria que ingresa en la piel a travs de grietas o cortes. Los tipos ms frecuentes de bacterias que causan celulitis son los estafilococos y los estreptococos. SIGNOS Y SNTOMAS   Enrojecimiento y calor.  Hinchazn.  Sensibilidad o dolor.  Cristy Hilts. DIAGNSTICO  Por lo general, el mdico puede diagnosticar esta afeccin con un examen fsico.  Es posible que le indiquen anlisis de [place]. TRATAMIENTO  El tratamiento consiste en tomar antibiticos. [place] los antibiticos como le indic el mdico. Termine los antibiticos aunque comience a sentirse mejor.  Mantenga el brazo o la pierna  elevados para reducir la hinchazn.  Aplique paos calientes en la zona hasta 4 veces al da para Glass blower/designer.  Tome los medicamentos solamente como se lo haya indicado el mdico.  Concurra a todas las visitas de control como se lo haya indicado el mdico. SOLICITE ATENCIN MDICA SI:   [place] Fast lnea roja en la piel que sale desde la zona infectada.  El rea roja se extiende o se vuelve de color oscuro.  El hueso o la articulacin que se encuentran por debajo de la zona infectada le duelen despus de que la piel se [place].  La infeccin se repite en la misma zona o en una zona diferente.  Tiene un bulto inflamado en la zona infectada.  Presenta nuevos sntomas.  Tiene fiebre. SOLICITE ATENCIN MDICA DE INMEDIATO SI:   Se siente muy somnoliento.  Tiene vmitos o diarrea.  Se siente enfermo (malestar general) con dolores musculares.   Esta informacin no tiene Marine scientist el consejo del mdico. Asegrese de hacerle al mdico cualquier pregunta que tenga.   Document Released: 05/23/2005 Document Revised: 05/04/2015 Elsevier Interactive Patient Education 2016 [employer].  Use stridex or Noxema pads as an astringent after showering.  Wear loose fitting white cotton t shirts and boxer shorts.

## 2015-11-09 LAB — HSV(HERPES SMPLX)ABS-I+II(IGG+IGM)-BLD
HERPES SIMPLEX VRS I-IGM AB (EIA): 0.34 {index}
HSV 1 GLYCOPROTEIN G AB, IGG: 21.7 {index} — AB (ref <0.90)
HSV 2 Glycoprotein G Ab, IgG: <0.9 Index (ref <0.90)

## 2015-11-10 DIAGNOSIS — B0089 Other herpesviral infection: Secondary | ICD-10-CM | POA: Insufficient documentation

## 2015-11-10 MED ORDER — ACYCLOVIR 400 MG PO TABS: 400.0000 mg | ORAL_TABLET | Freq: Three times a day (TID) | ORAL | Status: DC

## 2015-11-10 NOTE — Addendum Note (Signed)
Addended by: Boykin Nearing on: 11/10/2015 08:52 AM   Modules accepted: Orders

## 2015-11-14 NOTE — Telephone Encounter (Signed)
Pt notified paperwork ready to be pick up

## 2015-11-14 NOTE — Telephone Encounter (Signed)
Form [phone] completed  Ready for patient pick up

## 2015-11-15 ENCOUNTER — Telehealth: Payer: Self-pay | Admitting: *Deleted

## 2015-11-15 NOTE — Telephone Encounter (Signed)
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

## 2015-11-15 NOTE — Telephone Encounter (Signed)
LVM to return call.

## 2015-11-16 ENCOUNTER — Telehealth: Payer: Self-pay | Admitting: Family Medicine

## 2015-11-16 NOTE — Telephone Encounter (Signed)
Patient picked up paperwork form doctor

## 2015-11-28 ENCOUNTER — Telehealth: Payer: Self-pay | Admitting: Family Medicine

## 2015-11-28 NOTE — Telephone Encounter (Signed)
Patient would like to know last lab results. Please follow up.

## 2015-11-28 NOTE — Telephone Encounter (Signed)
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

## 2015-12-06 ENCOUNTER — Encounter: Payer: Self-pay | Admitting: Family Medicine

## 2015-12-06 ENCOUNTER — Ambulatory Visit: Payer: Medicaid Other | Attending: Family Medicine | Admitting: Family Medicine

## 2015-12-06 ENCOUNTER — Other Ambulatory Visit: Payer: Self-pay | Admitting: Family Medicine

## 2015-12-06 VITALS — BP 125/66 | HR 73 | Temp 98.3°F | Resp 16 | Ht 60.0 in | Wt 141.0 lb

## 2015-12-06 DIAGNOSIS — B009 Herpesviral infection, unspecified: Secondary | ICD-10-CM | POA: Insufficient documentation

## 2015-12-06 DIAGNOSIS — N189 Chronic kidney disease, unspecified: Secondary | ICD-10-CM | POA: Diagnosis not present

## 2015-12-06 DIAGNOSIS — E118 Type 2 diabetes mellitus with unspecified complications: Secondary | ICD-10-CM | POA: Diagnosis not present

## 2015-12-06 DIAGNOSIS — B0089 Other herpesviral infection: Secondary | ICD-10-CM

## 2015-12-06 DIAGNOSIS — R21 Rash and other nonspecific skin eruption: Secondary | ICD-10-CM | POA: Insufficient documentation

## 2015-12-06 DIAGNOSIS — N183 Chronic kidney disease, stage 3 unspecified: Secondary | ICD-10-CM | POA: Insufficient documentation

## 2015-12-06 DIAGNOSIS — Z79899 Other long term (current) drug therapy: Secondary | ICD-10-CM | POA: Diagnosis not present

## 2015-12-06 DIAGNOSIS — E1122 Type 2 diabetes mellitus with diabetic chronic kidney disease: Secondary | ICD-10-CM | POA: Diagnosis not present

## 2015-12-06 DIAGNOSIS — L0202 Furuncle of face: Secondary | ICD-10-CM | POA: Diagnosis not present

## 2015-12-06 DIAGNOSIS — E1165 Type 2 diabetes mellitus with hyperglycemia: Secondary | ICD-10-CM | POA: Insufficient documentation

## 2015-12-06 DIAGNOSIS — Z794 Long term (current) use of insulin: Secondary | ICD-10-CM | POA: Diagnosis not present

## 2015-12-06 DIAGNOSIS — IMO0002 Reserved for concepts with insufficient information to code with codable children: Secondary | ICD-10-CM

## 2015-12-06 LAB — GLUCOSE, POCT (MANUAL RESULT ENTRY)
POC GLUCOSE: 347 mg/dL — AB (ref 70–99)
POC Glucose: 392 mg/dl — AB (ref 70–99)

## 2015-12-06 MED ORDER — INSULIN ASPART PROT & ASPART (70-30 MIX) 100 UNIT/ML ~~LOC~~ SUSP: 40.0000 [IU] | Freq: Two times a day (BID) | SUBCUTANEOUS | Status: DC

## 2015-12-06 MED ORDER — "INSULIN SYRINGE-NEEDLE U-100 31G X 5/16"" 0.5 ML MISC": 1.0000 | Freq: Three times a day (TID) | Status: DC

## 2015-12-06 MED ORDER — INSULIN ASPART 100 UNIT/ML ~~LOC~~ SOLN
10.0000 [IU] | Freq: Once | SUBCUTANEOUS | Status: AC
  Administered 2015-12-06: 10 [IU] via SUBCUTANEOUS

## 2015-12-06 MED ORDER — "INSULIN SYRINGE-NEEDLE U-100 31G X 5/16"" 0.5 ML MISC": 1.0000 | Freq: Two times a day (BID) | Status: DC

## 2015-12-06 NOTE — Patient Instructions (Addendum)
Samantha was seen today for diabetes.  Diagnoses and all orders for this visit:  Uncontrolled type 2 diabetes mellitus with complication, with long-term current use of insulin (HCC) -     Glucose (CBG) -     insulin aspart (novoLOG) injection 10 Units; Inject 0.1 mLs (10 Units total) into the skin once. -     insulin aspart protamine- aspart (NOVOLOG MIX 70/30) (70-30) 100 UNIT/ML injection; Inject 0.4 mLs (40 Units total) into the skin 2 (two) times daily with a meal.    Diabetes blood sugar goals  Fasting (in AM before breakfast, 8 hrs of no eating or drinking (except water or unsweetened coffee or tea): 90-110 2 hrs after meals: < 160,   No low sugars: nothing < 70  Beware of hypoglycemia which is blood sugar less than 70 with or without symptoms.  The common symptoms of hypoglycemia are: sweating, pale or dusty skin, excessive fatigue, nausea, jitteriness. If you experience these symptoms please check your blood sugar.  My blood sugar is low  (less than 70). What should I do?  If low 60- 70, with or without symptoms. Do not take insulin or oral medication,  eat or drink carbohydrates right away (juice, sweets, breads, fruit). Recheck blood sugar in 2 hrs. If still low call your doctor. If normal take medication.   If 60-40 without symptoms.  Same as above and call your doctor.   If 60-40 with symptoms. Same as above. If symptoms resolve within 30 minutes of eating or drinking carbohydrates call your doctor. If symptoms persist call 911.   If less than 40 with or without symptoms. Same as above. Do not wait 30 minutes, instead call 911.    F/u in 4 weeks with pharmacist for CBG check F/u with me in 2 months for diabetes and HSV 1 rash  Dr. Adrian Blackwater  Virus del Herpes Simple (Herpes Simplex Virus) El virus del herpes simple es una infeccin viral que puede infectar a muchas reas diferentes del cuerpo, como los genitales y Equities trader. Hay dos tipos de virus del herpes simple: virus  del herpes simple 1 (VHS-1) y virus del herpes simple 2 (VHS-2). El VHS-1 se asocia tpicamente con infecciones de la boca y los labios. El VHS-2 se asocia tpicamente con infecciones de los genitales. Sin embargo, cualquier cepa del virus puede infectar a cualquier rea. El VHS puede ser transmitido a travs de las partculas de la saliva o el contacto sexual. Ardelia Mems forma inusual de VHS-1, conocida como herpes gladiatorum, se transmite de piel a piel, como en la [employer]. SNTOMAS  En algunos casos no hay sntomas.  Cristy Hilts.  Dolor de [place].  Dolores musculares.  [place]  Ardor genital  Dolor genital  Dolor al Garment/textile technologist.  [place].  Pequeas llagas en las zonas afectadas. [place] a una persona afectada.  Compartir cubiertos con Personal assistant.  Actividad sexual sin proteccin.  Mltiples parejas sexuales.  Deportes de contacto directo sin equipo Firefighter.  Contacto con llagas expuestas.  El estrs, las enfermedades y el fro [place] el riesgo de Scientist, research (physical sciences). PRONSTICO El foco primario de infeccin por VHS por lo general dura de 2 a 3 semanas. Sin embargo, puede durar Research scientist (medical). Despus del brote primario, el virus entra en una etapa conocida como latencia. Durante este [place], puede no haber sntomas fsicos de la infeccin. Despus de un perodo de tiempo, con algn acontecimiento, como el  estrs, el fro, o una enfermedad se disparar un nuevo brote. Este ciclo de latencia y brotes pueden continuar indefinidamente. Los brotes suelen ser ms leves en el tiempo. El cuerpo no puede deshacerse de VHS por s solo. POSIBLES COMPLICACIONES:  Recurrencia  Infeccin en otras reas del organismo, como en los ojos (infeccin ocular herptica, queratitis) y en casos raros en el cerebro (encefalitis herptica). [place] infecciones por  VHS se pueden tratar sin medicamentos. Durante un brote, no toque las llagas. Puede utilizar hielo para Best boy y suprimir el virus. La exposicin al sol es un desencadenante comn de un brote, por lo que el uso de Water quality scientist puede Tourist information centre manager. Evite todo contacto sexual durante los brotes. Durante los perodos de Soil scientist, es recomendable que use condones de ltex, lo que reducir la probabilidad de propagacin del virus a Nurse, children's. Los condones hechos de productos de origen animal no protegen contra el VHS. Los condones femeninos cubrir un rea ms grande que los condones [place], y pueden ofrecer mayor proteccin de la transmisin de VHS. La presencia de VHS no afectar la capacidad de un condn para protegerse Counsellor. Slo tome medicamentos para Conservation officer, historic buildings y las [place], segn se lo indique su mdico Existen muchas afirmaciones que ciertos cambios en la dieta podran prevenir un brote, pero estas afirmaciones no han sido probadas. Estas incluyen comer alimentos con alto contenido de L-lisina y baja en arginina (es decir, Financial trader, las remolachas, [place], peras, [place], pescado azul (como el salmn, el abadejo, el pargo y pez espada), brotes de soja, pollo y [place]unidad). Los atletas pueden retornar a la prctica de la actividad fsica una vez que hayan sido tratados y se encuentren asintomticos.    Esta informacin no tiene Marine scientist el consejo del mdico. Asegrese de hacerle al mdico cualquier pregunta que tenga.   Document Released: 05/30/2006 Document Revised: 11/05/2011 Elsevier Interactive Patient Education [employer].

## 2015-12-06 NOTE — Progress Notes (Signed)
F/U rash on body  Pt stated not taking valtrex  No pain today  No tobacco user  No suicidal thoughts in the past two weeks

## 2015-12-06 NOTE — Assessment & Plan Note (Signed)
HSV 1 dermatitis  Plan: Acyclovir CBG control to reduce risk of secondary cellulitis

## 2015-12-06 NOTE — Progress Notes (Signed)
Subjective:  Patient ID: Brenda Andrews, female    DOB: 1945/09/04  Age: 70 y.o. MRN: [id number]  Spanish interpreter used   CC: Diabetes   HPI Brenda Andrews presents for    1. CHRONIC DIABETES  Disease Monitoring  Blood Sugar Ranges: 150-350, down to 40, 3 months ago   Polyuria: no   Visual problems: no   Medication Compliance: yes  Medication Side Effects  Hypoglycemia: yes 3 months ago   [place] Exam: due   Foot Exam: done today   2. Rash: x 4 years. On L side under breast and L upper back/flank. Also with L forehead boil. She went to dermatology. Was tried on doxycycline x 2 which resulted in diarrhea. She completed a course of clindamycin. She went to ED on 11/07/15 for same problem and was advised to continue clindamycin. She was tested for HSV at last OV and was positive. She has not yet started acyclovir.   Social History  Substance Use Topics  . Smoking status: Never Smoker   . Smokeless tobacco: Not on file  . Alcohol Use: No    Outpatient Prescriptions Prior to Visit  Medication Sig Dispense Refill  . ACCU-CHEK FASTCLIX LANCETS MISC Used 3 time per day/before meals and at bed time 102 each 11  . acyclovir (ZOVIRAX) 400 MG tablet Take 1 tablet (400 mg total) by mouth 3 (three) times daily. 15 tablet 5  . atorvastatin (LIPITOR) 40 MG tablet Take 1 tablet (40 mg total) by mouth daily. 90 tablet 3  . clindamycin (CLEOCIN) 300 MG capsule Take 1 capsule (300 mg total) by mouth 3 (three) times daily. 30 capsule 0  . gabapentin (NEURONTIN) 300 MG capsule Take 1 capsule (300 mg total) by mouth at bedtime. 30 capsule 2  . glucose blood (ACCU-CHEK AVIVA PLUS) test strip 1 each by Other route 3 (three) times daily. E11.65 100 each 12  . ibuprofen (ADVIL,MOTRIN) 800 MG tablet Take 1 tablet (800 mg total) by mouth every 8 (eight) hours as needed. Must have office visit for refills 30 tablet 0  . insulin aspart protamine-  aspart (NOVOLOG MIX 70/30) (70-30) 100 UNIT/ML injection Inject 0.35 mLs (35 Units total) into the skin 2 (two) times daily with a meal. 10 mL 11  . lisinopril (PRINIVIL,ZESTRIL) 10 MG tablet TAKE 1 TABLET BY MOUTH DAILY 30 tablet 0  . NEEDLE, DISP, 30 G 30G X 1/2" MISC Use as directed for twice daily insulin injections (Patient not taking: Reported on 11/04/2015) 100 each 12  . sertraline (ZOLOFT) 50 MG tablet Take 50 mg by mouth daily. Reported on 11/04/2015     No facility-administered medications prior to visit.    ROS Review of Systems  Constitutional: Negative for fever and chills.  Eyes: Negative for visual disturbance.  Respiratory: Negative for shortness of breath.   Cardiovascular: Negative for chest pain.  Gastrointestinal: Negative for abdominal pain and blood in stool.  Musculoskeletal: Positive for myalgias. Negative for back pain and arthralgias.  Skin: Positive for rash.  Allergic/Immunologic: Negative for immunocompromised state.  Hematological: Negative for adenopathy. Does not bruise/bleed easily.  Psychiatric/Behavioral: Negative for suicidal ideas and dysphoric mood.    Objective:  BP 125/66 mmHg  Pulse 73  Temp(Src) 98.3 F (36.8 C) (Oral)  Resp 16  Ht 5' (1.524 m)  Wt 141 lb (63.957 kg)  BMI 27.54 kg/m2  SpO2 99%  BP/Weight 12/06/2015 11/07/2015 [id number]  Systolic BP 0000000 [id number] [id number]  Diastolic BP 66  46 62  Wt. (Lbs) 141 134.44 138  BMI 27.54 26.26 26.95   Physical Exam  Constitutional: She is oriented to person, place, and time. She appears well-developed and well-nourished. No distress.  HENT:  Head: Normocephalic and atraumatic.  Cardiovascular: Normal rate, regular rhythm, normal heart sounds and intact distal pulses.   Pulmonary/Chest: Effort normal and breath sounds normal.  Musculoskeletal: She exhibits no edema.  Neurological: She is alert and oriented to person, place, and time.  Skin: Skin is warm and dry. Rash noted.     Hyperpigmented  papules on b/l flanks and under both breast   Psychiatric: She has a normal mood and affect.    Lab Results  Component Value Date   HGBA1C 11.5 11/04/2015   CBG 392  Treated with 10 U of novolog  Repeat CBG 347   Assessment & Plan:   Brenda Andrews was seen today for diabetes.  Diagnoses and all orders for this visit:  Uncontrolled type 2 diabetes mellitus with complication, with long-term current use of insulin (HCC) -     Glucose (CBG) -     insulin aspart (novoLOG) injection 10 Units; Inject 0.1 mLs (10 Units total) into the skin once. -     insulin aspart protamine- aspart (NOVOLOG MIX 70/30) (70-30) 100 UNIT/ML injection; Inject 0.4 mLs (40 Units total) into the skin 2 (two) times daily with a meal. -     Discontinue: Insulin Syringe-Needle U-100 (B-D INS SYRINGE 0.5CC/31GX5/16) 31G X 5/16" 0.5 ML MISC; 1 each by Does not apply route 3 (three) times daily. -     Insulin Syringe-Needle U-100 (B-D INS SYRINGE 0.5CC/31GX5/16) 31G X 5/16" 0.5 ML MISC; 1 each by Does not apply route 2 (two) times daily. -     POCT glucose (manual entry)   No orders of the defined types were placed in this encounter.    Follow-up: No Follow-up on file.   Boykin Nearing MD

## 2015-12-06 NOTE — Assessment & Plan Note (Signed)
A: Uncontrolled diabetes with CKD Med: compliant P: Increase 70/30 to 40 U BID

## 2015-12-12 ENCOUNTER — Telehealth: Payer: Self-pay | Admitting: Family Medicine

## 2015-12-12 ENCOUNTER — Ambulatory Visit: Payer: Medicaid Other | Admitting: Podiatry

## 2015-12-12 NOTE — Telephone Encounter (Signed)
Patient's niece called to ask why the patients Acyclovir was canceled and not sent to the pharmacy.  Please f/u

## 2015-12-14 NOTE — Telephone Encounter (Signed)
Pharmacy was called pt still has refills  No Rx was cancelled

## 2015-12-30 ENCOUNTER — Ambulatory Visit (INDEPENDENT_AMBULATORY_CARE_PROVIDER_SITE_OTHER): Payer: Medicaid Other | Admitting: Podiatry

## 2015-12-30 DIAGNOSIS — L600 Ingrowing nail: Secondary | ICD-10-CM | POA: Diagnosis not present

## 2015-12-30 DIAGNOSIS — B351 Tinea unguium: Secondary | ICD-10-CM

## 2016-01-03 ENCOUNTER — Ambulatory Visit: Payer: Medicaid Other | Attending: Family Medicine | Admitting: Pharmacist

## 2016-01-03 ENCOUNTER — Encounter: Payer: Self-pay | Admitting: Pharmacist

## 2016-01-03 DIAGNOSIS — E118 Type 2 diabetes mellitus with unspecified complications: Secondary | ICD-10-CM

## 2016-01-03 DIAGNOSIS — Z794 Long term (current) use of insulin: Secondary | ICD-10-CM | POA: Diagnosis not present

## 2016-01-03 DIAGNOSIS — E1165 Type 2 diabetes mellitus with hyperglycemia: Secondary | ICD-10-CM | POA: Diagnosis not present

## 2016-01-03 DIAGNOSIS — IMO0002 Reserved for concepts with insufficient information to code with codable children: Secondary | ICD-10-CM

## 2016-01-03 MED ORDER — ACCU-CHEK AVIVA PLUS W/DEVICE KIT: PACK | Status: AC

## 2016-01-03 NOTE — Patient Instructions (Signed)
Follow up with Dr. Adrian Blackwater in 4 weeks.  Come back and see me in 2 weeks with your meter.  I sent in the new meter to [facility]  Control del nivel de glucosa en la sangre - Adultos (Blood Glucose Monitoring, Adult) El control de la glucosa en la sangre (tambin llamada azcar en la sangre) lo ayudar a tener la diabetes bajo control. Tambin ayuda a que usted y Chief Financial Officer la diabetes y determinen si el tratamiento es Armed forces logistics/support/administrative officer. POR QU HAY QUE CONTROLAR LA GLUCOSA EN LA SANGRE?  Esto puede ayudar a comprender de [employer], la actividad fsica y los medicamentos inciden en los niveles de [place].  Le permite conocer el nivel de glucosa en la sangre en cualquier momento dado. Puede saber rpidamente si el nivel es bajo (hipoglucemia) o alto (hiperglucemia).  Puede ser de ayuda para que usted y el mdico sepan cmo Water engineer,  y para entender cmo controlar una enfermedad o ajustar los medicamentos para hacer ejercicio. CUNDO DEBE HACERSE LAS PRUEBAS? El mdico lo ayudar a decidir con qu frecuencia deber [employer] niveles de glucosa en la [place]. Esto puede depender del tipo de diabetes que tenga, su control de la diabetes o los tipos de medicamentos que tome. Asegrese de anotar todos los valores de la glucosa en la [place], de modo que esta informacin pueda ser revisada por su mdico. A continuacin puede ver ejemplos de los momentos para Optometrist la prueba que el mdico puede Event organiser. Diabetes tipo1  Mdaselo al menos 2 veces al da si la diabetes est bien controlada, si [place] una bomba de insulina o si se aplica muchas inyecciones diarias.  Si la diabetes no est bien controlada o si est enfermo, puede ser necesario que se controle con ms frecuencia.  Es recomendable que tambin lo mida en estas oportunidades:  Antes de cada inyeccin de insulina.  Antes y despus de hacer ejercicio.  [place] comidas y 2horas despus de  Scientist, research (physical sciences).  Ocasionalmente, entre las 2:00a.m. y las 3:00a.m. Diabetes tipo2  Si est utilizando insulina, realice la medicin al menos 2 veces al [employer]. Sin embargo, es Multimedia programmer medicin antes de cada inyeccin de [place].  Si toma medicamentos por boca (va oral), hgase la prueba 2veces por da.  Si sigue una dieta controlada, hgase la prueba una vez por da.  Si la diabetes no est bien controlada o si est enfermo, puede ser necesario que se controle con ms frecuencia. CMO CONTROLAR EL NIVEL DE GLUCOSA EN LA SANGRE Insumos necesarios  Medidor de glucosa en la sangre.  Tiras reactivas para el medidor. Cada medidor tiene sus propias tiras reactivas. [place] tiras reactivas correspondientes a su medidor.  Una aguja para pinchar (lanceta).  Un dispositivo que sujeta la lanceta (dispositivo de puncin).  Un diario o libro de anotaciones para [employer]. Procedimiento  Lave sus manos con agua y [place]. No se recomienda usar alcohol.  Pnchese el costado del dedo (no la punta) con Retail buyer.  Apriete suavemente el dedo hasta que aparezca una pequea gota de [place].  Siga las instrucciones que vienen con el medidor para Garment/textile technologist tira Comptroller, Midwife la sangre sobre la tira y usar el medidor de Printmaker. Otras zonas de las que se puede tomar sangre para la prueba Algunos medidores le permiten tomar sangre para la prueba de otras zonas del cuerpo (que no son el dedo). Estas reas se llaman sitios alternativos.  Los sitios alternativos ms comunes son los siguientes:  El Management consultant.  El muslo.  La zona posterior de la parte inferior de la pierna.  La palma de la mano. El flujo de sangre en estas zonas es ms lento. Por lo tanto, los valores de glucosa en la sangre que obtenga pueden estar demorados, y los nmeros son diferentes de los que obtiene de los dedos. No saque sangre de sitios alternativos si cree que tiene hipoglucemia. Los  valores no sern precisos. Siempre extraiga del dedo si tiene hipoglucemia. Adems, si no puede darse cuenta cuando tiene bajos los niveles (hipoglucemia asintomtica), siempre extraiga sangre de los dedos para los controles de glucosa en la [place]. CONSEJOS ADICIONALES PARA EL CONTROL DE LA GLUCOSA  No vuelva a [place] lancetas.  Siempre tenga los insumos a mano.  Todos los medidores de glucosa incluyen un nmero de telfono "directo", disponible las 24 horas, al que podr llamar si tiene preguntas o [place].  Ajuste (calibre) el medidor de glucosa con una solucin de control despus de terminar algunas cajas de tiras reactivas. LLEVE REGISTROS DE LOS NIVELES DE GLUCOSA EN LA SANGRE Es recomendable llevar un diario o un registro de los valores de glucosa en la [place]. La [employer] de los medidores de glucosa, sino todos, conservan el registro de la glucosa en el dispositivo. Algunos medidores permiten descargar los registros a su computadora. Llevar un registro de los valores de glucosa en la sangre es especialmente til si desea observar los patrones. Haga anotaciones simultneas con la Teacher, English as a foreign language de los valores de glucosa en la sangre debido a que podra olvidar lo que ocurri en el momento exacto. Llevar un buen registro los ayudar a usted y al mdico a Fish farm manager juntos para Scientist, forensic un buen control de la diabetes.    Esta informacin no tiene Marine scientist el consejo del mdico. Asegrese de hacerle al mdico cualquier pregunta que tenga.   Document Released: 08/13/2005 Document Revised: 09/03/2014 Elsevier Interactive Patient Education [employer].

## 2016-01-03 NOTE — Progress Notes (Signed)
S:    Patient arrives in good spirits with a Optometrist.  Presents for diabetes evaluation, education, and management at the request of Dr. Adrian Blackwater. Patient was referred on 12/06/15.  Patient was last seen by Primary Care Provider on 12/06/15.   Patient reports adherence with medications.  Current diabetes medications include: Novolog Mix 70/30  Patient reports like she feels like her blood glucose might be low sometimes but her blood glucose meter has been broken so she has been unable to check. E3 and E6 keep appearing on the meter. She feels like sometimes her blood glucose is really high and other times it is low based on symptoms but has not been able to check her blood glucose in a few weeks.  Patient reported dietary habits: Eats 3 meals/day. This morning she ate eggs and green beans.  Patient has paperwork for citizenship that she would like Dr. Adrian Blackwater to fill out.   O:  Lab Results  Component Value Date   HGBA1C 11.5 11/04/2015   There were no vitals filed for this visit.  POCT glucose = 314  A/P: Diabetes longstanding currently UNcontrolled based on A1c of 11.5 and POCT glucose. Patient denies hypoglycemic events and is able to verbalize appropriate hypoglycemia management plan. Patient reports adherence with medication. Control is suboptimal due to sedentary lifestyle and inability to monitor blood glucose at home.  Continue Novolog Mix 70/30 40 units twice daily since I don't have any readings to see and patient feels like her blood glucose really fluctuates. Ordered patient a new blood glucose meter so that she can monitor her blood glucose at home. She will bring the meter back to her next visit so that we can assess her blood glucose control and make any changes. Will not administer insulin here in clinic since she does not have a way to monitor her blood glucose once she leaves in case it would become low. Patient to pick up meter later today.   Paperwork for citizenship  placed in Dr. Adrian Blackwater' box  Next A1C anticipated June 2017.    Written patient instructions provided.  Total time in face to face counseling 20 minutes.   Follow up in Pharmacist Clinic Visit in 2 weeks for CBG log review.

## 2016-01-04 DIAGNOSIS — L600 Ingrowing nail: Secondary | ICD-10-CM | POA: Insufficient documentation

## 2016-01-04 NOTE — Progress Notes (Signed)
Patient ID: Brenda Andrews, female   DOB: 1945-11-02, 70 y.o.   MRN: [id number]  Subjective: 70 year old female presents the office today for concerns of reoccurring ingrown toenails her big toenails which is been ongoing for approximately 15 years been worsening over the last 7 years. She periodically gets areas chance that she doesn't for permanent procedure. She denies any redness, drainage or any swelling. She does get relief of symptoms after the toenails trimmed. She denied vastus was performed which were normal. Her last A1c was 11.5. Denies any systemic complaints such as fevers, chills, nausea, vomiting. No acute changes since last appointment, and no other complaints at this time.   Objective: AAO x3, NAD DP/PT pulses palpable bilaterally however posterior tibial are decreased, CRT less than 3 seconds No areas of pinpoint bony tenderness or pain with vibratory sensation. MMT 5/5, ROM WNL. There is incurvation of both the medial and lateral aspects of bilateral hallux toenails with tenderness to patient toenails distally. There is no surrounding edema, erythema, drainage or pus. There is no clinical signs of infection. No edema, erythema, increase in warmth to bilateral lower extremities.  No open lesions or pre-ulcerative lesions.  No pain with calf compression, swelling, warmth, erythema  Assessment: Symptomatic ingrown toenails of bilateral hallux however there is no signs of infection  Plan: -All treatment options discussed with the patient including all alternatives, risks, complications.  -Reviewed Sherlynn Stalls studies as well as previous A1c with the patient. Given her elevated A1c I would recommend holding off on permanent nail avulsions at this point. She understands. The nails were debrided without complications to patient comfort. During the debridement no bleeding occurred. -Follow-up as needed -Patient encouraged to call the office with any questions, concerns, change  in symptoms.   Celesta Gentile, DPM

## 2016-01-16 ENCOUNTER — Telehealth: Payer: Self-pay | Admitting: Family Medicine

## 2016-01-16 NOTE — Telephone Encounter (Signed)
Patient came into office requesting status of paperwork  citizenship . Please f/up

## 2016-01-18 ENCOUNTER — Encounter: Payer: Self-pay | Admitting: Pharmacist

## 2016-01-18 ENCOUNTER — Ambulatory Visit: Payer: Medicaid Other | Attending: Family Medicine | Admitting: Pharmacist

## 2016-01-18 DIAGNOSIS — IMO0002 Reserved for concepts with insufficient information to code with codable children: Secondary | ICD-10-CM

## 2016-01-18 DIAGNOSIS — E1165 Type 2 diabetes mellitus with hyperglycemia: Secondary | ICD-10-CM | POA: Diagnosis not present

## 2016-01-18 DIAGNOSIS — E118 Type 2 diabetes mellitus with unspecified complications: Secondary | ICD-10-CM | POA: Diagnosis not present

## 2016-01-18 DIAGNOSIS — Z794 Long term (current) use of insulin: Secondary | ICD-10-CM

## 2016-01-18 MED ORDER — INSULIN ASPART PROT & ASPART (70-30 MIX) 100 UNIT/ML ~~LOC~~ SUSP: 44.0000 [IU] | Freq: Two times a day (BID) | SUBCUTANEOUS | Status: DC

## 2016-01-18 NOTE — Progress Notes (Signed)
S:    Patient arrives in good spirits with a Optometrist.  Presents for diabetes evaluation, education, and management at the request of Dr. Adrian Blackwater. Patient was referred on 12/06/15.  Patient was last seen by Primary Care Provider on 12/06/15.   Patient reports adherence with medications.  Current diabetes medications include: Novolog Mix 70/30 40 units BID.  Patient reports monitoring her blood glucose in the mornings (fasting) when she can but sometimes she has a hard time seeing and is unable to use the meter.   Patient reported dietary habits: no changes.  O:  Lab Results  Component Value Date   HGBA1C 11.5 11/04/2015   There were no vitals filed for this visit.  Home fastings: 260s-400  A/P: Diabetes longstanding currently UNcontrolled based on A1c of 11.5 and home CBGs. Patient denies hypoglycemic events and is able to verbalize appropriate hypoglycemia management plan. Patient reports adherence with medication. Control is suboptimal due to sedentary lifestyle. I think her high CBGs could also be due to pain from her herpes outbreak.   Increase Novolog Mix 70/30 44 units twice daily. Patient to continue to monitor blood glucose at home. Patient took her mixed insulin just prior to visit so will not administer more insulin in clinic. Encouraged her to keep trying to watch what she eats and decrease her carbohydrate intake.   Next A1C anticipated June 2017.    Written patient instructions provided.  Total time in face to face counseling 20 minutes.   Follow up with Dr. Adrian Blackwater in 2 weeks or with me if she is unable to schedule an appointment.

## 2016-01-18 NOTE — Telephone Encounter (Signed)
Patient came in requesting the status of paperwork. Please follow up.

## 2016-01-18 NOTE — Patient Instructions (Signed)
Thanks for coming to see me  Increase your insulin to 44 units twice a day  Keep checking your blood sugar every day  Schedule an appointment with Dr. Adrian Blackwater next week and if you can't get in to see her, come back to see me in 2 weeks  Recuento bsico de carbohidratos para la diabetes mellitus (Basic Carbohydrate Counting for Diabetes Mellitus) El recuento de carbohidratos es un mtodo destinado a calcular la cantidad de carbohidratos en la dieta. El consumo de carbohidratos aumenta naturalmente el nivel de azcar (glucosa) en la sangre, por lo que es importante que sepa la cantidad que debe incluir en cada comida. El recuento de carbohidratos ayuda a Advertising account executive de glucosa en la sangre dentro de los lmites normales. La cantidad permitida de carbohidratos es diferente para cada persona. Un nutricionista puede ayudarlo a calcular la cantidad adecuada para usted. Una vez que sepa la cantidad de carbohidratos que puede consumir, podr calcular los carbohidratos de los alimentos que desea comer. Los siguientes alimentos incluyen carbohidratos:  Granos, como panes y cereales.  Frijoles secos y productos con soja.  Vegetales almidonados, como papas, guisantes y maz.  Lambert Mody y jugos de frutas.  Leche y Estate agent.  Dulces y bocadillos, como pastel, galletas, caramelos, papas fritas de bolsa, refrescos y bebidas frutales con azcar. RECUENTO DE CARBOHIDRATOS [employer] de calcular los carbohidratos de los alimentos. Puede usar cualquiera de los dos mtodos o [place] combinacin de [place]. Leer la etiqueta de informacin nutricional de los alimentos envasados La informacin nutricional es una etiqueta incluida en casi todas las bebidas y los alimentos envasados de los [place]. Indica el tamao de la porcin de ese alimento o bebida e informacin sobre los nutrientes de cada porcin, incluso los gramos (g) de carbohidratos por porcin.  Decida la cantidad de porciones que comer o tomar  de este alimento o bebida. Multiplique la cantidad de porciones por el nmero de gramos de carbohidratos indicados en la etiqueta para esa porcin. El total ser la cantidad de carbohidratos que consumir al comer ese alimento o tomar esa bebida. Conocer las porciones estndar de los alimentos Cuando coma alimentos no envasados o que no incluyan la informacin nutricional en la etiqueta, deber medir las porciones para poder calcular la cantidad de carbohidratos. Una porcin de la mayora de los alimentos ricos en carbohidratos contiene alrededor de 15g de carbohidratos. La siguiente [employer] tamaos de porcin de los alimentos ricos en carbohidratos que contienen alrededor de 15g de carbohidratos por porcin:   1rebanada de pan (1oz) o 1tortilla de seis pulgadas.  panecillo de hamburguesa o bollito tipo ingls.  4a 6galletas.   de taza de cereal sin azcar y seco.   taza de cereal caliente.   de taza de arroz o pastas.  taza de pur de papas o de una papa grande al horno.  1taza de frutas frescas o una fruta pequea.  taza de frutas o jugo de frutas enlatados o congelados.  [place].   de taza de yogur descremado sin ningn agregado o de yogur endulzado con edulcorante artificial.  taza de vegetales almidonados, como guisantes, maz o papas, o de frijoles secos cocidos. Decida la cantidad de porciones Gaff[place]. Multiplique la cantidad de porciones por 15 (los gramos de carbohidratos en esa porcin). Por ejemplo, si come 2tazas de fresas, habr comido 2porciones y 30g de carbohidratos (2porciones x 15g = 30g). [place] y guisos, en las que  se mezcla ms de un alimento, deber contar los carbohidratos de cada alimento incluido. EJEMPLO DE RECUENTO DE CARBOHIDRATOS Ejemplo de cena  3 onzas de pechugas de pollo.   de taza de arroz integral.   taza de [place].  1 taza de fresas con crema batida  sin azcar. Clculo de [place] 1: Identifique los alimentos que contienen carbohidratos:   Arroz.  Maz.  Leche.  Hughie Closs. [place] 2: Calcule el nmero de porciones que consumir de cada uno:   2 porciones de Occupational psychologist.  1 porcin de maz.  [place].  1 porcin de fresas. [place] 3: Multiplique cada una de esas porciones por 15g:   2 porciones de arroz x 15 g = 30 g.  1 porcin de maz x 15 g = 15 g.  1 porcin de leche x 15 g = 15 g.  1 porcin de fresas x 15 g = 15 g. [place] 4: Sume todas las cantidades para Armed forces logistics/support/administrative officer total de gramos de carbohidratos consumidos: 30 g + 15 g + 15 g + 15 g = 75 g.   Esta informacin no tiene Marine scientist el consejo del mdico. Asegrese de hacerle al mdico cualquier pregunta que tenga.   Document Released: 11/05/2011 Document Revised: 09/03/2014 Elsevier Interactive Patient Education 2016 [place] (Hypoglycemia) Un bajo nivel de azcar en la sangre (hipoglucemia) implica que es menor de lo que debera ser. Algunos signos de hipoglucemia son:  Transpiracin abundante.  Aumento del apetito.  Sensacin de [employer] o debilidad.  Sentir ms sueo que lo habitual.  Nerviosismo.  Dolor de [place].  Pulsaciones rpidas. El nivel de azcar en sangre puede descender rpidamente y puede ser Engineer, maintenance (IT). Su mdico puede hacerle estudios para Public house manager. Puede ser que tenga hipoglucemia y no sea diabtico. CUIDADOS EN EL HOGAR  Controle el nivel de azcar en la sangre como lo indique su mdico. Si es menor de 70 mg / dl , o el que le indique su mdico, tome una de las siguientes medidas:  3  4 tabletas de glucosa.   taza de jugo liviano.   taza de gaseosa que no sea diettica.  [place]  5  6 caramelos duros.  Vuelva a controlarse despus de 15 minutos. Repita hasta que el nivel sea el [place].  Tome una colacin si falta ms de 1 hora hasta la prxima  comida.  Tome los medicamentos tal como se los prescribi el mdico.  No saltee las comidas. Coma siempre a la misma hora.  Debe evitar las bebidas alcohlicas, excepto con las comidas.  Controle su nivel de glucosa antes de conducir.  Controle su nivel de glucosa antes y despus de hacer actividad fsica.  Lleve siempre los medicamentos con usted,como por ejemplo las pastillas para la glucosa.  Siempre use un brazalete de alerta mdica si es diabtico. SOLICITE AYUDA DE INMEDIATO SI:   El nivel de glucosa desciende por debajo de 70 mg / dl o el que le indique su mdico, y:  Se siente confundido  No puede tragar.  Se desmaya.  No puede solucionarlo por sus propios medios. Puede ser necesario que alguien lo ayude.  Este problema le ocurre con frecuencia.  Tiene algn problema con los medicamentos.  No se siente bien despus de 3  4 das.  Observa cambios en la visin. ASEGRESE DE QUE:   Comprende estas instrucciones.  Controlar la enfermedad.  Solicitar [employer]  de inmediato si no mejora o empeora.   Esta informacin no tiene Marine scientist el consejo del mdico. Asegrese de hacerle al mdico cualquier pregunta que tenga.   Document Released: 09/15/2010 Document Revised: 09/03/2014 Elsevier Interactive Patient Education [employer].

## 2016-02-03 ENCOUNTER — Encounter: Payer: Self-pay | Admitting: Family Medicine

## 2016-02-03 ENCOUNTER — Ambulatory Visit: Payer: Medicaid Other | Attending: Family Medicine | Admitting: Family Medicine

## 2016-02-03 VITALS — BP 135/65 | HR 71 | Temp 98.3°F | Resp 16 | Ht 60.0 in | Wt 135.0 lb

## 2016-02-03 DIAGNOSIS — E1122 Type 2 diabetes mellitus with diabetic chronic kidney disease: Secondary | ICD-10-CM | POA: Diagnosis present

## 2016-02-03 DIAGNOSIS — Z794 Long term (current) use of insulin: Secondary | ICD-10-CM | POA: Diagnosis not present

## 2016-02-03 DIAGNOSIS — E1165 Type 2 diabetes mellitus with hyperglycemia: Secondary | ICD-10-CM

## 2016-02-03 DIAGNOSIS — B009 Herpesviral infection, unspecified: Secondary | ICD-10-CM | POA: Insufficient documentation

## 2016-02-03 DIAGNOSIS — Z79899 Other long term (current) drug therapy: Secondary | ICD-10-CM | POA: Insufficient documentation

## 2016-02-03 DIAGNOSIS — E118 Type 2 diabetes mellitus with unspecified complications: Secondary | ICD-10-CM | POA: Diagnosis not present

## 2016-02-03 DIAGNOSIS — K5901 Slow transit constipation: Secondary | ICD-10-CM | POA: Insufficient documentation

## 2016-02-03 DIAGNOSIS — B0089 Other herpesviral infection: Secondary | ICD-10-CM

## 2016-02-03 DIAGNOSIS — IMO0002 Reserved for concepts with insufficient information to code with codable children: Secondary | ICD-10-CM

## 2016-02-03 DIAGNOSIS — N183 Chronic kidney disease, stage 3 unspecified: Secondary | ICD-10-CM

## 2016-02-03 LAB — POCT URINALYSIS DIPSTICK
Bilirubin, UA: NEGATIVE
Blood, UA: NEGATIVE
GLUCOSE UA: 100
Ketones, UA: NEGATIVE
Leukocytes, UA: NEGATIVE
NITRITE UA: NEGATIVE
Protein, UA: NEGATIVE
Spec Grav, UA: 1.01
UROBILINOGEN UA: 0.2
pH, UA: 5.5

## 2016-02-03 LAB — BASIC METABOLIC PANEL WITH GFR
BUN: 47 mg/dL — AB (ref 7–25)
CHLORIDE: 105 mmol/L (ref 98–110)
CO2: 20 mmol/L (ref 20–31)
CREATININE: 1.66 mg/dL — AB (ref 0.60–0.93)
Calcium: 9.3 mg/dL (ref 8.6–10.4)
GFR, Est African American: 36 mL/min — ABNORMAL LOW (ref >=60)
GFR, Est Non African American: 31 mL/min — ABNORMAL LOW (ref >=60)
GLUCOSE: 328 mg/dL — AB (ref 65–99)
POTASSIUM: 5.4 mmol/L — AB (ref 3.5–5.3)
Sodium: 136 mmol/L (ref 135–146)

## 2016-02-03 LAB — POCT GLYCOSYLATED HEMOGLOBIN (HGB A1C): HEMOGLOBIN A1C: 12.5

## 2016-02-03 LAB — GLUCOSE, POCT (MANUAL RESULT ENTRY): POC Glucose: 307 mg/dl — AB (ref 70–99)

## 2016-02-03 MED ORDER — INSULIN ASPART PROT & ASPART (70-30 MIX) 100 UNIT/ML ~~LOC~~ SUSP: 50.0000 [IU] | Freq: Two times a day (BID) | SUBCUTANEOUS | Status: DC

## 2016-02-03 MED ORDER — TRAMADOL HCL 50 MG PO TABS: 50.0000 mg | ORAL_TABLET | Freq: Three times a day (TID) | ORAL | Status: DC | PRN

## 2016-02-03 MED ORDER — ACYCLOVIR 400 MG PO TABS: 400.0000 mg | ORAL_TABLET | Freq: Three times a day (TID) | ORAL | Status: DC

## 2016-02-03 MED ORDER — POLYETHYLENE GLYCOL 3350 17 GM/SCOOP PO POWD: 17.0000 g | Freq: Every day | ORAL | Status: DC | PRN

## 2016-02-03 NOTE — Assessment & Plan Note (Addendum)
Remains uncontrolled with hyperglycemia on 50 U BID of novolog 70/30  Plan: Check BMP with GFR Plan to add metformin if GFR > 30  If GFR < 30 will increase insulin to 60 U BID, she will need a different syringe, the 1 ml   Reviewed labs, hyperglycemia,  GFR is stable at 31. Plan: d/c novolog 70/30 50 U BID Start lantus 50 U daily Start novolog 10 U TID with meals   Close f/u with titrate insulin and review CBGs Endo referral  Renal referral

## 2016-02-03 NOTE — Assessment & Plan Note (Signed)
HSV 1 dermatitis Refilled acyclovir  Tramadol for pain Screening HIV to rule out immuno comprising co morbidities

## 2016-02-03 NOTE — Progress Notes (Signed)
Subjective:  Patient ID: Brenda Andrews, female    DOB: 1945-12-13  Age: 70 y.o. MRN: [id number]  Spanish interpreter Brenda Andrews [phone]  CC: Diabetes   HPI Brenda Andrews presents for her friend  Brenda Andrews   1. CHRONIC DIABETES  Disease Monitoring  Blood Sugar Ranges:  Fasting: 176-332 Postprandial: 148-385  Polyuria: no   Visual problems: no   Medication Compliance: yes , but has increased to 50 U of 70/30 BID for the past month  Medication Side Effects  Hypoglycemia: no She is eating low carb overall. She does minimal exercise     2. Rash: x 4 years. On L side under breast and L upper back/flank. Also with L forehead boil. She went to dermatology. Was tried on doxycycline x 2 which resulted in diarrhea. She completed a course of clindamycin. She went to ED on 11/07/15 for same problem and was advised to continue clindamycin. 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 She has been unable to get refills of acyclovir. Her rash is painful but not as red or as swollen as it usually is.   3. Constipation: this is chronic. She is not using prune juice as she is afraid it will elevate her blood sugar. No OTC stool softeners.   Social History  Substance Use Topics  . Smoking status: Never Smoker   . Smokeless tobacco: Not on file  . Alcohol Use: No    Outpatient Prescriptions Prior to Visit  Medication Sig Dispense Refill  . ACCU-CHEK FASTCLIX LANCETS MISC Used 3 time per day/before meals and at bed time 102 each 11  . acyclovir (ZOVIRAX) 400 MG tablet Take 1 tablet (400 mg total) by mouth 3 (three) times daily. 15 tablet 5  . atorvastatin (LIPITOR) 40 MG tablet TAKE 1 TABLET BY MOUTH DAILY 90 tablet 0  . Blood Glucose Monitoring Suppl (ACCU-CHEK AVIVA PLUS) w/Device KIT Use as directed - E11.65 1 kit 0  . gabapentin (NEURONTIN) 300 MG capsule Take 1 capsule (300 mg total) by mouth at bedtime. 30 capsule 2  . glucose blood (ACCU-CHEK AVIVA PLUS)  test strip 1 each by Other route 3 (three) times daily. E11.65 100 each 12  . ibuprofen (ADVIL,MOTRIN) 800 MG tablet TAKE 1 TABLET BY MOUTH EVERY 8 HOURS AS NEEDED 30 tablet 0  . insulin aspart protamine- aspart (NOVOLOG MIX 70/30) (70-30) 100 UNIT/ML injection Inject 0.44 mLs (44 Units total) into the skin 2 (two) times daily with a meal. 10 mL 11  . Insulin Syringe-Needle U-100 (B-D INS SYRINGE 0.5CC/31GX5/16) 31G X 5/16" 0.5 ML MISC 1 each by Does not apply route 2 (two) times daily. 300 each 3  . lisinopril (PRINIVIL,ZESTRIL) 10 MG tablet TAKE 1 TABLET BY MOUTH DAILY 30 tablet 2  . sertraline (ZOLOFT) 50 MG tablet Take 50 mg by mouth daily. Reported on 12/06/2015     No facility-administered medications prior to visit.    ROS Review of Systems  Constitutional: Negative for fever and chills.  Eyes: Negative for visual disturbance.  Respiratory: Negative for shortness of breath.   Cardiovascular: Negative for chest pain.  Gastrointestinal: Positive for constipation. Negative for abdominal pain and blood in stool.  Musculoskeletal: Positive for myalgias. Negative for back pain and arthralgias.  Skin: Positive for rash.  Allergic/Immunologic: Negative for immunocompromised state.  Hematological: Negative for adenopathy. Does not bruise/bleed easily.  Psychiatric/Behavioral: Negative for suicidal ideas and dysphoric mood.    Objective:  BP 135/65 mmHg  Pulse 71  Temp(Src) 98.3 F (36.8 C) (Oral)  Resp 16  Ht 5' (1.524 m)  SpO2 100%  BP/Weight 02/03/2016 12/06/2015 7/32/2025  Systolic BP 427 062 376  Diastolic BP 65 66 46  Wt. (Lbs) - 141 134.44  BMI - 27.54 26.26   Physical Exam  Constitutional: She is oriented to person, place, and time. She appears well-developed and well-nourished. No distress.  HENT:  Head: Normocephalic and atraumatic.  Cardiovascular: Normal rate, regular rhythm, normal heart sounds and intact distal pulses.   Pulmonary/Chest: Effort normal and breath  sounds normal.  Musculoskeletal: She exhibits no edema.  Neurological: She is alert and oriented to person, place, and time.  Skin: Skin is warm and dry. Rash noted.     Hyperpigmented papules on b/l flanks and under both breast   Psychiatric: She has a normal mood and affect.    Lab Results  Component Value Date   HGBA1C 11.5 11/04/2015   Lab Results  Component Value Date   HGBA1C 11.5 11/04/2015    CBG 307   Assessment & Plan:   [name] was seen today for diabetes.  Diagnoses and all orders for this visit:  Uncontrolled type 2 diabetes mellitus with complication, with long-term current use of insulin (HCC) -     POCT glucose (manual entry) -     POCT urinalysis dipstick -     POCT glycosylated hemoglobin (Hb A1C) -     insulin aspart protamine- aspart (NOVOLOG MIX 70/30) (70-30) 100 UNIT/ML injection; Inject 0.5 mLs (50 Units total) into the skin 2 (two) times daily with a meal.  Herpes simplex virus type 1 (HSV-1) dermatitis -     HIV antibody (with reflex) -     acyclovir (ZOVIRAX) 400 MG tablet; Take 1 tablet (400 mg total) by mouth 3 (three) times daily. -     traMADol (ULTRAM) 50 MG tablet; Take 1 tablet (50 mg total) by mouth every 8 (eight) hours as needed for moderate pain.  CKD (chronic kidney disease) stage 3, GFR 30-59 ml/min -     BASIC METABOLIC PANEL WITH GFR  Slow transit constipation -     polyethylene glycol powder (GLYCOLAX/MIRALAX) powder; Take 17 g by mouth daily as needed for moderate constipation.   No orders of the defined types were placed in this encounter.    Follow-up: No Follow-up on file.   Boykin Nearing MD

## 2016-02-03 NOTE — Patient Instructions (Addendum)
Brenda Andrews was seen today for diabetes.  Diagnoses and all orders for this visit:  Uncontrolled type 2 diabetes mellitus with complication, with long-term current use of insulin (HCC) -     POCT glucose (manual entry) -     POCT urinalysis dipstick -     POCT glycosylated hemoglobin (Hb A1C) -     insulin aspart protamine- aspart (NOVOLOG MIX 70/30) (70-30) 100 UNIT/ML injection; Inject 0.5 mLs (50 Units total) into the skin 2 (two) times daily with a meal.  Herpes simplex virus type 1 (HSV-1) dermatitis -     HIV antibody (with reflex) -     acyclovir (ZOVIRAX) 400 MG tablet; Take 1 tablet (400 mg total) by mouth 3 (three) times daily. -     traMADol (ULTRAM) 50 MG tablet; Take 1 tablet (50 mg total) by mouth every 8 (eight) hours as needed for moderate pain.  CKD (chronic kidney disease) stage 3, GFR 30-59 ml/min -     BASIC METABOLIC PANEL WITH GFR  Slow transit constipation -     polyethylene glycol powder (GLYCOLAX/MIRALAX) powder; Take 17 g by mouth daily as needed for moderate constipation.  Will plan to add metformin at long as GFR is > 30   You will be called when immigration paperwork has been completed and faxed  F/u in 4 weeks for blood sugar check  Dr. Adrian Blackwater

## 2016-02-03 NOTE — Progress Notes (Signed)
F/U DM  Elevated glucose today  Glucose running fasting 148-332  Took insuline 50 unit at 8:00 before  breakfast  No pain today  No suicidal thoughts in the past two weeks

## 2016-02-04 LAB — HIV ANTIBODY (ROUTINE TESTING W REFLEX): HIV 1&2 Ab, 4th Generation: NONREACTIVE

## 2016-02-04 MED ORDER — INSULIN ASPART PROT & ASPART (70-30 MIX) 100 UNIT/ML ~~LOC~~ SUSP: 60.0000 [IU] | Freq: Two times a day (BID) | SUBCUTANEOUS | Status: DC

## 2016-02-04 MED ORDER — INSULIN GLARGINE 100 UNIT/ML SOLOSTAR PEN: 50.0000 [IU] | PEN_INJECTOR | Freq: Every day | SUBCUTANEOUS | Status: DC

## 2016-02-04 MED ORDER — INSULIN PEN NEEDLE 31G X 8 MM MISC: 1.0000 "application " | Freq: Four times a day (QID) | Status: DC

## 2016-02-04 MED ORDER — INSULIN ASPART 100 UNIT/ML FLEXPEN: 10.0000 [IU] | PEN_INJECTOR | Freq: Three times a day (TID) | SUBCUTANEOUS | Status: DC

## 2016-02-04 NOTE — Addendum Note (Signed)
Addended by: Boykin Nearing on: 02/04/2016 12:56 PM   Modules accepted: Orders

## 2016-02-06 ENCOUNTER — Telehealth: Payer: Self-pay | Admitting: *Deleted

## 2016-02-06 NOTE — Telephone Encounter (Signed)
Patient returned nurse phone call. °Please follow up. °

## 2016-02-06 NOTE — Telephone Encounter (Signed)
LVM to return call.

## 2016-02-06 NOTE — Telephone Encounter (Signed)
-----   Message from Boykin Nearing, MD sent at 02/04/2016 12:58 PM EDT ----- Here is the decision based on last OV and labs review Reviewed labs, hyperglycemia,  GFR is stable at 31. Plan: STOP novolog 70/30 50 U BID Start lantus 50 U daily Start novolog 10 U TID with meals  Check sugars before novolog dosing. DO NOT increase novolog on your own at risk of low sugar is high if you take too much   Close f/u with me to adjust insulin doses as needed based on blood sugars fasting and after meals  Endo referral  Renal referral

## 2016-02-07 ENCOUNTER — Telehealth: Payer: Self-pay | Admitting: Family Medicine

## 2016-02-07 NOTE — Telephone Encounter (Signed)
Called for extra information on the insulin that was recently prescribed to pt  Also asked if pts immigration paperwork was close to being completed  Please assist. Thank you

## 2016-02-10 NOTE — Telephone Encounter (Signed)
Patient's friend called wanting to know status of immigration papers, also she had question about medication and lab results. Please follow up.

## 2016-02-16 NOTE — Telephone Encounter (Signed)
Immigration form completed and placed up front for pick up in an envelope.  Patient needs to pick up form and sign the last page.  Please ask what questions she had about labs and meds. Junious Dresser gave lab results and change in insulin plan from 70/30 to lantus and novolog.

## 2016-02-20 NOTE — Telephone Encounter (Signed)
Called pt via [place], [place]. Reached message stating caller does not have voice mailbox set up.

## 2016-02-21 ENCOUNTER — Telehealth: Payer: Self-pay

## 2016-02-21 ENCOUNTER — Telehealth: Payer: Self-pay | Admitting: Family Medicine

## 2016-02-21 ENCOUNTER — Ambulatory Visit: Payer: Medicaid Other | Attending: Family Medicine | Admitting: Pharmacist

## 2016-02-21 DIAGNOSIS — E1165 Type 2 diabetes mellitus with hyperglycemia: Secondary | ICD-10-CM

## 2016-02-21 DIAGNOSIS — Z794 Long term (current) use of insulin: Secondary | ICD-10-CM

## 2016-02-21 DIAGNOSIS — IMO0002 Reserved for concepts with insufficient information to code with codable children: Secondary | ICD-10-CM

## 2016-02-21 DIAGNOSIS — E118 Type 2 diabetes mellitus with unspecified complications: Secondary | ICD-10-CM

## 2016-02-21 MED ORDER — INSULIN GLARGINE 100 UNIT/ML SOLOSTAR PEN: 40.0000 [IU] | PEN_INJECTOR | Freq: Every day | SUBCUTANEOUS | Status: DC

## 2016-02-21 NOTE — Telephone Encounter (Signed)
Patient is needing immigration paperwork filled out again. In part III section 1 and 2. Its needs to be detailed about all medical problems. Doctor only mentioned depression and patient has other persisting medical problems. Please follow up.

## 2016-02-21 NOTE — Progress Notes (Signed)
S:    Patient arrives in good spirits with a Optometrist. [place] used for duration of visit as well.  Presents for diabetes evaluation, education, and management at the request of Dr. Adrian Blackwater. Patient was referred on 12/06/15.  Patient was last seen by Primary Care Provider on 12/06/15.   Patient reports adherence with medications.  Current diabetes medications include: Lantus 40 units daily and Novolog 10 units before meals. She was supposed to be on Lantus 50 units but she has been taking 40 units at home.  She was recently changed from Mix insulin BID to basal/bolus. She believes that her blood glucose is much better controlled on this regimen.   O:  Lab Results  Component Value Date   HGBA1C 12.5 02/03/2016   There were no vitals filed for this visit.  Home fastings: 68-100s  A/P: Diabetes longstanding currently UNcontrolled based on A1c of 12.5 but improved based on home CBGs. Patient reports 1 hypoglycemic events and is able to verbalize appropriate hypoglycemia management plan. Patient reports adherence with medication.   Continue Lantus 40 units daily and Novolog 10 units prior to meals. Do not want her to take the 50 units of Lantus since she had the 68 today and since the 40 units appears to be controlling her CBGs. I think that the patient has really benefits from the switch to basal/bolus insulin and she is really happy with the change. Overall, her CBGs have been controlled and she is feeling much better. Encouraged her to keep up the great work and to let us know if she has another episode of hypoglycemia or if her blood glucose starts to be consistently >200.  Next A1C anticipated June 2017.    Written patient instructions provided.  Total time in face to face counseling 20 minutes.  Follow up with Dr. Adrian Blackwater in 2 weeks or with me if she is unable to schedule an appointment.

## 2016-02-21 NOTE — Telephone Encounter (Signed)
Please inform patient  Paperwork will be reviewed and adjusted if I determine that to be medically necessary

## 2016-02-21 NOTE — Telephone Encounter (Signed)
Called patient  Used [employer] [place] ID# [phone] [place] disability form

## 2016-02-21 NOTE — Patient Instructions (Addendum)
Thanks for coming to see [place]!  Continue Lantus 40 units daily and Novolog 10 units before meals.  Follow up with Dr. Adrian Blackwater in 2 weeks  Diabetes y normas bsicas de atencin mdica (Diabetes and Standards of Medical Care) La diabetes es una enfermedad complicada. El equipo que trate su diabetes deber incluir un nutricionista, un enfermero, un educador para la diabetes, un oftalmlogo y ms. Para que todas las personas conozcan sobre su enfermedad y para que los pacientes tengan los cuidados que necesitan, se crearon las siguientes normas bsicas para un mejor control. A continuacin se indican los estudios, vacunas, medicamentos, educacin y planes que necesitar. Prueba de HbA1c Esta prueba muestra cmo ha sido controlada su glucosa en los ltimos 2 o 3 meses. Se utiliza para verificar si el plan de control de la diabetes debe ser ajustado.   Hgalos al menos 2 veces al ao si cumple los objetivos del [place].  Si le han cambiado el tratamiento o si no cumple con los objetivos del [place], debe hacerlo 4 veces al ao. Control de la presin arterial.  Hgalas en cada visita mdica de rutina. El objetivo es tener menos de 140/90 mm Hg en la mayora de las personas, pero 130/80 mm Hg en algunos casos. Consulte a su mdico acerca de su objetivo. Examen dental.  Concurra regularmente a las visitas de control con el dentista. Examen ocular.  Si le diagnosticaron diabetes tipo 1 siendo un nio, debe hacerse estudios al llegar a los 10 aos o ms y si ha sufrido de diabetes durante 3 a 5 aos. Se recomienda hacer anualmente los exmenes oculares despus de ese examen inicial.  Si le diagnosticaron diabetes tipo 1 siendo adulto, hgase un examen dentro de los 5 aos del diagnstico y luego una vez por ao.  Si le diagnosticaron diabetes tipo 2, hgase un estudio lo antes posible despus del diagnstico y luego una vez por ao. Examen de los pies  Se har una inspeccin visual en cada  visita mdica de rutina. Estos controles observarn si hay cortes, lesiones u otros problemas en los pies.  Debe realizarse un examen completo de los pies cada ao. Esto incluye revisar la estructura y la piel de los pies, y examinar los pulsos y la sensacin de los pies.  Diabetes tipo 1: La primera prueba se realiza 5 aos despus del diagnstico.  Diabetes tipo 2: La primera prueba se realiza en el momento del diagnstico.  Contrlese los pies todas las noches para ver si hay cortes, lesiones u otros problemas. Comunquese con su mdico si observa que no se curan. Estudio de la funcin renal (microalbmina en orina)  Debe realizarse una vez por ao.  Diabetes tipo 1: La primera prueba se realiza a los 5 aos despus del diagnstico.  Diabetes tipo2: La primera prueba se realiza en el momento del diagnstico.  La creatinina srica y el ndice de filtracin glomerular estimada (eGFR, por sus siglas en ingls) se realizan una vez por ao para informar el nivel de enfermedad renal crnica, si la hubiera. Perfil lipdico (colesterol, HDL, LDL, triglicridos).  La mayora de las personas lo hacen cada 5 aos.  En relacin al LDL, el objetivo es tener menos de 100 mg/dl. Si tiene alto riesgo, el objetivo es tener menos de 70 mg/dl.  En relacin al HDL, el objetivo es [employer] 40 y 29 mg/dl para los hombres y entre 43 y 81 mg/dl para las mujeres. Un nivel de colesterol HDL de 60 mg/dl  o superior da una cierta proteccin contra la enfermedad cardaca.  En relacin a los triglicridos, el objetivo es tener menos de 150 mg/dl. Vacunas  Se recomienda aplicar de forma anual la vacuna contra la gripe a todas las personas de 6 meses en adelante que tengan diabetes.  La vacuna contra la neumona (antineumoccica) est recomendada para todas las personas de 2 aos en adelante que tengan diabetes. Los adultos de 65 aos o ms pueden recibir la vacuna antineumoccica como una serie de dos  inyecciones diferentes.  Se recomienda administrar la vacuna contra la hepatitis B en adultos poco despus de que hayan recibido el diagnstico de diabetes.  La vacuna Tdap (contra el ttanos, la difteria y la tosferina) debe aplicarse de la siguiente manera:  Segn las pautas normales de vacunacin infantil en el caso de los nios.  Cada 10 aos en el caso de los adultos con diabetes. Educacin para el autocontrol de la diabetes  Recomendaciones al momento del diagnstico y los controles segn sea necesario. Plan de tratamiento  Su plan de tratamiento ser revisado en cada visita mdica.   Esta informacin no tiene Marine scientist el consejo del mdico. Asegrese de hacerle al mdico cualquier pregunta que tenga.   Document Released: 11/07/2009 Document Revised: 09/03/2014 Elsevier Interactive Patient Education 2016 [place] diabetes mellitus y los alimentos (Diabetes Mellitus and Food) Es importante que controle su nivel de azcar en la sangre (glucosa). El nivel de glucosa en sangre depende en gran medida de lo que usted come. [place] alimentos saludables en las cantidades [place] a lo largo del Training and development officer, aproximadamente a la misma hora [employer], lo ayudar a Chief Technology Officer su nivel de Multimedia programmer. Tambin puede ayudarlo a retrasar o Patent attorney de la diabetes mellitus. Comer de [employer] saludable incluso puede ayudarlo a Chartered loss adjuster de presin arterial y a Science writer o Theatre manager un peso saludable.  Entre las recomendaciones generales para alimentarse y Audiological scientist los alimentos de forma saludable, se incluyen las siguientes:  Respetar las comidas principales y comer colaciones con regularidad. Evitar pasar largos perodos sin comer con el fin de perder peso.  Seguir una dieta que consista principalmente en alimentos de origen vegetal, como frutas, vegetales, frutos secos, legumbres y cereales integrales.  Utilizar mtodos de coccin a baja temperatura, como  hornear, en lugar de mtodos de coccin a alta temperatura, como frer en abundante aceite. Trabaje con el nutricionista para aprender a Financial planner nutricional de las etiquetas de los alimentos. CMO PUEDEN AFECTARME LOS ALIMENTOS? Carbohidratos Los carbohidratos afectan el nivel de glucosa en sangre ms que cualquier otro tipo de alimento. El nutricionista lo ayudar a Teacher, adult education cuntos carbohidratos puede consumir en cada comida y ensearle a contarlos. El recuento de carbohidratos es importante para mantener la glucosa en sangre en un nivel saludable, en especial si utiliza insulina o toma determinados medicamentos para la diabetes mellitus. Alcohol El alcohol puede provocar disminuciones sbitas de la glucosa en sangre (hipoglucemia), en especial si utiliza insulina o toma determinados medicamentos para la diabetes mellitus. La hipoglucemia es una afeccin que puede poner en peligro la vida. Los sntomas de la hipoglucemia (somnolencia, mareos y Data processing manager) son similares a los sntomas de haber consumido mucho alcohol.  Si el mdico lo autoriza a beber alcohol, hgalo con moderacin y siga estas pautas:  Las mujeres no deben beber ms de un trago por da, y los hombres no deben beber ms de dos tragos por Training and development officer. Un trago es  igual a:  12 onzas (355 ml) de cerveza  5 onzas de vino (150 ml) de vino  1,5onzas (4m) de bebidas espirituosas  No beba con el estmago vaco.  Mantngase hidratado. Beba agua, gaseosas dietticas o t helado sin azcar.  Las gaseosas comunes, los jugos y otros refrescos podran contener muchos carbohidratos y se dCivil Service fast streamer QU ALIMENTOS NO SE RECOMIENDAN? Cuando haga las elecciones de alimentos, es importante que recuerde que todos los alimentos son distintos. Algunos tienen menos nutrientes que otros por porcin, aunque podran tener la misma cantidad de caloras o carbohidratos. Es difcil darle al cuerpo lo que necesita cuando consume alimentos  con menos nutrientes. Estos son algunos ejemplos de alimentos que debera evitar ya que contienen muchas caloras y carbohidratos, pero pocos nutrientes:  GPhysicist, medicaltrans (la mayora de los alimentos procesados incluyen grasas trans en la etiqueta de Informacin nutricional).  Gaseosas comunes.  Jugos.  Caramelos.  Dulces, como tortas, pasteles, rosquillas y gGrant  Comidas fritas. QU ALIMENTOS PUEDO COMER? Consuma alimentos ricos en nutrientes, que nutrirn el cuerpo y lo mantendrn saludable. Los alimentos que debe comer tambin dependern de varios factores, como:  Las caloras que necesita.  Los medicamentos que toma.  Su peso.  El nivel de glucosa en [place]  El nSchleswigde presin arterial.  El nivel de colesterol. Debe consumir una amplia variedad de alimentos, por ejemplo:  Protenas.  Cortes de cNucor Corporation  Protenas con bajo contenido de grasas saturadas, como pescado, clara de huevo y frijoles. Evite las carnes procesadas.  Frutas y vegetales.  Frutas y vPhotographerque pueden ayudar a cChief Technology Officerlos niveles sanguneos de gHiwassee como mMission mangos y batatas.  Productos lcteos.  Elija productos lcteos sin grasa o con bajo contenido de gNorth Star como lHardin yogur y qCamanche Village  Cereales, panes, pastas y arroz.  Elija cereales integrales, como panes multicereales, avena en grano y arroz integral. Estos alimentos pueden ayudar a controlar la presin arterial.  GDaphene Jaeger  Alimentos que contengan grasas saludables, como frutos secos, aMusician aceite de oSedalia aceite de canola y pescado. TODOS LOS QUE PADECEN DIABETES MELLITUS TIENEN EL MGoodfieldPLAN DE [place] Dado que todas las personas que padecen diabetes mellitus son distintas, no hay un solo plan de comidas que funcione para todos. Es muy importante que se rena con un nutricionista que lo ayudar a crear un plan de comidas adecuado para usted.   Esta informacin no tiene cMarine scientistel consejo del  mdico. Asegrese de hacerle al mdico cualquier pregunta que tenga.   Document Released: 11/20/2007 Document Revised: 09/03/2014 Elsevier Interactive Patient Education 2016 [employer]ynolds [employer]  Diabetes mellitus tipo2, adultos (Type 2 Diabetes Mellitus, Adult) La diabetes mellitus tipo2 es una enfermedad de larga duracin (crnica). En la diabetes tipo 2:  El pncreas no produce la cantidad suficiente de una hormona llamada insulina.  Las clulas del cuerpo no responden a la iDover Corporationse produce.  Pueden ocurrir ambas situaciones. Normalmente, la iLoews Corporationazcares de los alimentos a las clulas de los tejidos. Esto le proporciona la energa. Si usted tiene diabetes tipo 2, los azcares no se pYouth workera las clulas del tejido. Esto produce un aumento del nivel de aDispensing optician(hiperglucemia).  El mGenworth Financialobjetivos personales del tratamiento para usted en funcin de su edad, los medicamentos que toma, el tiempo que hace que tiene diabetes y cualquier otra enfermedad que padezca. Generalmente, el objetivo del tratamiento es mFamily Dollar Storessiguientes niveles sanguneos de glucosa:  Antes de las comidas (preprandial): de 80 a 130 mg/dl.  Despus de las comidas (posprandial): por debajo de 180 mg/dl.  A1c: menos del 6,5 % al 7 %. CUIDADOS EN EL HOGAR  Controle su nivel de hemoglobina A1c dos veces al ao. Este nivel muestra si la diabetes est controlada o est fuera de control.  Mdase el nivel de azcar en la sangre todos los [place] se lo indic su mdico.  Verifique el nivel de cetonas en la orina cuando est enfermo y segn lo que le hayan indicado.  [employer] Corporation medicamentos para la diabetes o para la insulina tal como se los prescribi el mdico.  [place] se quede sin insulina.  Ajuste la cantidad de insulina que se administra segn la cantidad de carbohidratos (hidratos de carbono) que come. Los hidratos de carbono se encuentran en muchos alimentos,  tales como frutas, verduras, cereales integrales y productos lcteos.  Tome una colacin sana entre cada comida saludable. Debe consumir 3 comidas y 3 colaciones diarias.  Baje de peso si es necesario.  Lleve una tarjeta de alerta mdica o use una pulsera o medalla de alerta mdica.  Lleve una colacin de 15gramos de hidratos de carbono en todo momento. Por ejemplo:  Pastillas de glucosa, 3 o 4  Gel de glucosa, tubo de 15 gramos.  Pasas de uva, 2 cucharadas (24 gramos).  Caramelos de goma, 6.  Galletas de [place], 8.  Gaseosa comn (no diettica), 4onzas (159mlilitros).  Pastillas de goma, 9.  Note los sntomas de niveles bajos de aDispensing optician(hipoglucemia), como:  Sacudidas (temblores).  Dificultad para pensar claramente.  Sudoracin.  Frecuencia cardaca acelerada.  Dolor de cN[place]  Sequedad en la boca.  Hambre.  Malhumor (irritabilidad).  Preocupacin o tensin (ansiedad).  Sueo agitado.  Cambios en el habla o en la coordinacin.  Confusin.  Controle la hipoglucemia inmediatamente. Si usted est alerta y puede tragar, siga la regla de 15/15:  Tome entre 15 y 248gramos de glucosa o hidratos de carbono de accin rpida. Incluye un gel de glucosa, pastillas de glucosa o 4 onzas (120 mililitros) de jugo de frutas, gaseosa comn, o lUSG Corporation  Verifique su nivel de azcar en la sangre 139mutos despus de tomar la glucosa.  Tome entre 15 y 20gramos ms de la glucosa si al repetir el anlisis del nivel de azcar en la sangre todava es de 7016ml (miligramos/decilitro) o inferior.  Ingiera una comida o un aperitivo 1 hora despus de norEducation officer, communitys niveles de azcDispensing opticianNote los primeros sntomas de niveles elevados de azcar en la sangre, tales como:  Tener mucha sed o beber mucho (polidipsia).  Orinar mucho (poliuria).  Haga por lo menos 150m31mos de actiSamoaca a la semana o segn lo que le hayan  indicado.  Divida los 150 minutos de actividad durante la semana. No haga los 150mi23ms de activLexicographeralice ejercicios como levantar pesas, por lo menos 2 veces a la semana o segn lo que le hayan indicado.  No permanezca inactivo durante ms de 90min76m seguidos.  Ajuste la insulina o la ingesta de alimentos, segn sea necesario, si inicia un nuevo ejercicio o deporte.  Siga su plan para das de enfermedad cuando no pueda comer o beber como de costumbre.  No fume, no masque tabaco y no use cigarrillos electrnicos.  Las mujeres que no estn embarazadas no deben beber ms de 1 medida de alcohol al da. LoTraining and development officerhombres no  deben beber ms de 2 medidas de alcohol por da.  Solo beba alcohol junto con la comida.  Pregntele a su mdico si el alcohol es seguro para usted.  Dgale a su mdico si usted bebe alcohol varias veces durante la semana.  Consulte al mdico con regularidad.  Programe un examen ocular de inmediato despus de que le hayan diagnosticado diabetes. Programe los exmenes una vez al ao.  Revise la piel y [employer]. Examine si hay cortes, moretones, enrojecimiento, problemas en las uas, sangrado, ampollas o llagas. El Teacher, adult education un examen de los pies una vez al ao.  Cepille los dientes y [place]. Use el hilo dental una vez al da. Visite a su dentista regularmente.  Comparta su plan de control de diabetes con su trabajo o con la escuela.  Mantngase al da con las vacunas que combaten enfermedades.  Vacnese contra la gripe todos los aos.  Vacnese contra la neumona. Si es mayor de 48 aos y nunca se Materials engineer neumona, tal vez tenga que [employer] vacunas.  Consulte al mdico qu otras vacunas debe aplicarse.  Aprenda a sobrellevar el estrs.  Reciba apoyo y educacin para la diabetes si lo siente necesario.  Pdale ayuda a su mdico si:  Necesita ayuda para Theatre manager o mejorar la forma de  hacer las cosas por su cuenta.  Necesita ayuda para mantener o mejorar la calidad de vida.  Tiene problemas en el pie o en la mano.  Tiene problemas a la hora de baarse, vestirse, comer o hacer actividad fsica. SOLICITE AYUDA SI:  No puede comer o beber durante ms de 6horas.  Tiene nuseas o vomita durante ms de 6horas.  Su nivel de azcar en la sangre es mayor de 240 mg/dl.  Presenta algn cambio en el estado mental.  Tiene otra enfermedad grave.  Tiene heces lquidas (diarrea) durante ms de 6horas.  Ha estado enfermo o ha tenido fiebre por dos o ms das y no se Building surveyor.  Tiene dolor cuando est activo. SOLICITE AYUDA DE INMEDIATO SI:  Tiene dificultad para respirar.  Los niveles de cetona estn ms altos de lo que el mdico dice que Armed forces logistics/support/administrative officer. ASEGRESE DE QUE:  Comprende estas instrucciones.  Controlar su afeccin.  Recibir ayuda de inmediato si no mejora o si empeora.   Esta informacin no tiene Marine scientist el consejo del mdico. Asegrese de hacerle al mdico cualquier pregunta que tenga.   Document Released: 11/09/2008 Document Revised: 12/28/2014 Elsevier Interactive Patient Education [employer].

## 2016-02-21 NOTE — Telephone Encounter (Signed)
Called pt via pac[employer] interpreter [place], [place]. Reached voicemail. Left message to return call at [phone].

## 2016-02-21 NOTE — Telephone Encounter (Signed)
Please inform patient that medically relevant problems were added to part 3 section 1 and 2  Form is ready for pick Please copy and place copy in to be faxed.

## 2016-02-23 ENCOUNTER — Other Ambulatory Visit: Payer: Self-pay | Admitting: Family Medicine

## 2016-02-24 NOTE — Telephone Encounter (Signed)
Called pt via [employer], Brenda Andrews [id number]. Pt verified name and date of birth. Pt notified that her immigration papers are ready for pick up and relevant problems were added to part 3 section 1 and 2. Pt voiced understanding.

## 2016-02-27 ENCOUNTER — Telehealth: Payer: Self-pay | Admitting: Family Medicine

## 2016-02-27 MED ORDER — LISINOPRIL 10 MG PO TABS: 10.0000 mg | ORAL_TABLET | Freq: Every day | ORAL | Status: DC

## 2016-02-27 NOTE — Telephone Encounter (Signed)
I called back and spoke to Vera from after hours nurse line. Patient's caregiver [name] called. Home BP 110/50 She is not taking lisinopril 10 mg  x 2 weeks Feeling weak CBG 118  Plan: Continue to not take lisinopril Keep fu on 03/06/16 Will check BP at fu and determine of lisinopril needs to be completely discontinued or dose reduced, she has diabetes Goal BP < 140/90.

## 2016-03-01 NOTE — Telephone Encounter (Signed)
Pt called requesting immigration papers. Please f/up

## 2016-03-05 ENCOUNTER — Telehealth: Payer: Self-pay | Admitting: Family Medicine

## 2016-03-05 NOTE — Telephone Encounter (Signed)
[employer] Brenda Andrews [id number] contacted patient and aware form is ready for pick up

## 2016-03-05 NOTE — Telephone Encounter (Signed)
Please inform patient that [id number] form is signed and ready for pick up

## 2016-03-06 ENCOUNTER — Ambulatory Visit: Payer: Medicaid Other | Attending: Family Medicine | Admitting: Family Medicine

## 2016-03-06 ENCOUNTER — Encounter: Payer: Self-pay | Admitting: Family Medicine

## 2016-03-06 VITALS — BP 111/68 | HR 79 | Temp 98.2°F | Resp 16 | Wt 141.0 lb

## 2016-03-06 DIAGNOSIS — Z794 Long term (current) use of insulin: Secondary | ICD-10-CM | POA: Diagnosis not present

## 2016-03-06 DIAGNOSIS — B0089 Other herpesviral infection: Secondary | ICD-10-CM | POA: Diagnosis not present

## 2016-03-06 DIAGNOSIS — E118 Type 2 diabetes mellitus with unspecified complications: Secondary | ICD-10-CM

## 2016-03-06 DIAGNOSIS — E1142 Type 2 diabetes mellitus with diabetic polyneuropathy: Secondary | ICD-10-CM | POA: Diagnosis not present

## 2016-03-06 DIAGNOSIS — E1165 Type 2 diabetes mellitus with hyperglycemia: Secondary | ICD-10-CM | POA: Diagnosis not present

## 2016-03-06 DIAGNOSIS — IMO0002 Reserved for concepts with insufficient information to code with codable children: Secondary | ICD-10-CM

## 2016-03-06 LAB — GLUCOSE, POCT (MANUAL RESULT ENTRY): POC Glucose: 151 mg/dl — AB (ref 70–99)

## 2016-03-06 MED ORDER — INSULIN ASPART 100 UNIT/ML FLEXPEN: 7.0000 [IU] | PEN_INJECTOR | Freq: Three times a day (TID) | SUBCUTANEOUS | Status: DC

## 2016-03-06 MED ORDER — GABAPENTIN 300 MG PO CAPS: 300.0000 mg | ORAL_CAPSULE | Freq: Every day | ORAL | Status: DC

## 2016-03-06 NOTE — Progress Notes (Signed)
Subjective:  Patient ID: Brenda Andrews, female    DOB: 11/05/1945  Age: 70 y.o. MRN: [id number]  Spanish interpreter Clide Dales [phone]  CC: No chief complaint on file.   HPI [name] presents for her friend  Brenda Andrews   1. CHRONIC DIABETES  Disease Monitoring  Blood Sugar Ranges: 68-363 extremes.   Polyuria: no   Visual problems: no   Medication Compliance: yes, lantus 40 U, novolog 10 U TID  Medication Side Effects  Hypoglycemia: 68 once  She is eating low carb overall. She does minimal exercise     2. Rash: x 4 years. On L side under breast and L upper back/flank. Also with L forehead boil. She went to dermatology. Was tried on doxycycline x 2 which resulted in diarrhea. She completed a course of clindamycin. She went to ED on 11/07/15 for same problem and was advised to continue clindamycin. 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 Her rash has improved with improvement in her CBGs. She no longer has pain.   Social History  Substance Use Topics  . Smoking status: Never Smoker   . Smokeless tobacco: Not on file  . Alcohol Use: No    Outpatient Prescriptions Prior to Visit  Medication Sig Dispense Refill  . ACCU-CHEK FASTCLIX LANCETS MISC Used 3 time per day/before meals and at bed time 102 each 11  . acyclovir (ZOVIRAX) 400 MG tablet Take 1 tablet (400 mg total) by mouth 3 (three) times daily. 15 tablet 5  . atorvastatin (LIPITOR) 40 MG tablet TAKE 1 TABLET BY MOUTH DAILY 90 tablet 0  . Blood Glucose Monitoring Suppl (ACCU-CHEK AVIVA PLUS) w/Device KIT Use as directed - E11.65 1 kit 0  . gabapentin (NEURONTIN) 300 MG capsule TAKE ONE CAPSULE BY MOUTH AT BEDTIME 30 capsule 2  . glucose blood (ACCU-CHEK AVIVA PLUS) test strip 1 each by Other route 3 (three) times daily. E11.65 100 each 12  . insulin aspart (NOVOLOG FLEXPEN) 100 UNIT/ML FlexPen Inject 10 Units into the skin 3 (three) times daily with meals. 15 mL 11  . Insulin  Glargine (LANTUS SOLOSTAR) 100 UNIT/ML Solostar Pen Inject 40 Units into the skin daily at 10 pm. 5 pen 11  . Insulin Pen Needle (B-D ULTRAFINE III SHORT PEN) 31G X 8 MM MISC 1 application by Does not apply route 4 (four) times daily. 120 each 11  . lisinopril (PRINIVIL,ZESTRIL) 10 MG tablet Take 1 tablet (10 mg total) by mouth daily. 30 tablet 2  . polyethylene glycol powder (GLYCOLAX/MIRALAX) powder Take 17 g by mouth daily as needed for moderate constipation. 3350 g 1  . sertraline (ZOLOFT) 50 MG tablet Take 50 mg by mouth daily. Reported on 12/06/2015    . traMADol (ULTRAM) 50 MG tablet Take 1 tablet (50 mg total) by mouth every 8 (eight) hours as needed for moderate pain. 20 tablet 0   No facility-administered medications prior to visit.    ROS Review of Systems  Constitutional: Negative for fever and chills.  Eyes: Negative for visual disturbance.  Respiratory: Negative for shortness of breath.   Cardiovascular: Negative for chest pain.  Gastrointestinal: Positive for constipation. Negative for abdominal pain and blood in stool.  Musculoskeletal: Positive for myalgias. Negative for back pain and arthralgias.  Skin: Positive for color change. Negative for rash.  Allergic/Immunologic: Negative for immunocompromised state.  Hematological: Negative for adenopathy. Does not bruise/bleed easily.  Psychiatric/Behavioral: Negative for suicidal ideas and dysphoric mood.    Objective:  BP 111/68 mmHg  Pulse 79  Temp(Src) 98.2 F (36.8 C) (Oral)  Resp 16  Wt 141 lb (63.957 kg)  SpO2 97%  BP/Weight 03/06/2016 02/03/2016 4/38/8875  Systolic BP 797 282 060  Diastolic BP 68 65 66  Wt. (Lbs) 141 135 141  BMI 27.54 26.37 27.54   Physical Exam  Constitutional: She is oriented to person, place, and time. She appears well-developed and well-nourished. No distress.  HENT:  Head: Normocephalic and atraumatic.  Cardiovascular: Normal rate, regular rhythm, normal heart sounds and intact distal  pulses.   Pulmonary/Chest: Effort normal and breath sounds normal.  Musculoskeletal: She exhibits no edema.  Neurological: She is alert and oriented to person, place, and time.  Skin: Skin is warm and dry. No erythema.  Hyperpigmented papules on L  flank and under both breast   Psychiatric: She has a normal mood and affect.    Lab Results  Component Value Date   HGBA1C 12.5 02/03/2016   CBG 151   Assessment & Plan:   Brenda Andrews was seen today for diabetes.  Diagnoses and all orders for this visit:  Uncontrolled type 2 diabetes mellitus with complication, with long-term current use of insulin (HCC) -     Glucose (CBG) -     insulin aspart (NOVOLOG FLEXPEN) 100 UNIT/ML FlexPen; Inject 7 Units into the skin 3 (three) times daily with meals.  Diabetic polyneuropathy associated with type 2 diabetes mellitus (HCC) -     gabapentin (NEURONTIN) 300 MG capsule; Take 1 capsule (300 mg total) by mouth at bedtime.   No orders of the defined types were placed in this encounter.    Follow-up: Return in about 4 weeks (around 04/03/2016) for CBG review with Erline Levine .   Boykin Nearing MD

## 2016-03-06 NOTE — Patient Instructions (Addendum)
Brenda Andrews was seen today for diabetes.  Diagnoses and all orders for this visit:  Uncontrolled type 2 diabetes mellitus with complication, with long-term current use of insulin (HCC) -     Glucose (CBG) -     insulin aspart (NOVOLOG FLEXPEN) 100 UNIT/ML FlexPen; Inject 7 Units into the skin 3 (three) times daily with meals.  Diabetic polyneuropathy associated with type 2 diabetes mellitus (HCC) -     gabapentin (NEURONTIN) 300 MG capsule; Take 1 capsule (300 mg total) by mouth at bedtime.   F/u in 4 weeks with Erline Levine for CBG review decreased novolog from 10 U to 7 U three times daily   Fu with me in  2 months for  repeat A1c   Dr. Adrian Blackwater

## 2016-03-06 NOTE — Progress Notes (Signed)
Concerned about BP 98/52 last week but she was asymptomatic.Not taking Lisinopril.

## 2016-03-08 NOTE — Assessment & Plan Note (Signed)
Dermatitis has improved with improvement in CBGs Continue to monitor Treat with acyclovir prn flare

## 2016-03-08 NOTE — Assessment & Plan Note (Signed)
A: improved with addition of lantus and novolog One low CBG P: Decrease novolog from 10 to 7 U TID Continue lantus 40 U daily

## 2016-03-09 ENCOUNTER — Encounter: Payer: Self-pay | Admitting: Podiatry

## 2016-03-09 ENCOUNTER — Ambulatory Visit (INDEPENDENT_AMBULATORY_CARE_PROVIDER_SITE_OTHER): Payer: Medicaid Other | Admitting: Podiatry

## 2016-03-09 DIAGNOSIS — M79675 Pain in left toe(s): Secondary | ICD-10-CM | POA: Diagnosis not present

## 2016-03-09 DIAGNOSIS — M79674 Pain in right toe(s): Secondary | ICD-10-CM | POA: Diagnosis not present

## 2016-03-09 DIAGNOSIS — B351 Tinea unguium: Secondary | ICD-10-CM

## 2016-03-09 DIAGNOSIS — L600 Ingrowing nail: Secondary | ICD-10-CM

## 2016-03-19 NOTE — Progress Notes (Signed)
Patient ID: Brenda Andrews, female   DOB: [birth year], 70 y.o.   MRN: [id number]  Subjective: 70 y.o. returns the office today for painful, elongated, thickened toenails which she cannot trim herself. Denies any redness or drainage around the nails. She states her big toenails are continuing to be ingrown and painful but denies any redness, drainage, or other signs of infection. Denies any acute changes since last appointment and no new complaints today. Denies any systemic complaints such as fevers, chills, nausea, vomiting.   Objective: AAO 3, NAD DP/PT pulses palpable, CRT less than 3 seconds Nails hypertrophic, dystrophic, elongated, brittle, discolored 10. There is tenderness overlying the nails 1-5 bilaterally. There is no surrounding erythema or drainage along the nail sites. Incurvation of bilateral hallux nails tenderness distally. Upon debridement there is resolution of symptoms. No open lesions or pre-ulcerative lesions are identified. No other areas of tenderness bilateral lower extremities. No overlying edema, erythema, increased warmth. No pain with calf compression, swelling, warmth, erythema.  Assessment: Patient presents with symptomatic onychomycosis. Ingrown toenail  Plan: -Treatment options including alternatives, risks, complications were discussed -Nails sharply debrided 10 without complication/bleeding. -Would recommend holding off any elective procedure as there is no signs of infection her last A1c was 12.5. -Discussed daily foot inspection. If there are any changes, to call the office immediately.  -Follow-up in 3 months or sooner if any problems are to arise. In the meantime, encouraged to call the office with any questions, concerns, changes symptoms.  Celesta Gentile, DPM

## 2016-03-26 ENCOUNTER — Other Ambulatory Visit: Payer: Self-pay | Admitting: Family Medicine

## 2016-03-26 DIAGNOSIS — IMO0002 Reserved for concepts with insufficient information to code with codable children: Secondary | ICD-10-CM

## 2016-03-26 DIAGNOSIS — E1165 Type 2 diabetes mellitus with hyperglycemia: Secondary | ICD-10-CM

## 2016-04-05 ENCOUNTER — Ambulatory Visit: Payer: Medicaid Other | Admitting: Pharmacist

## 2016-04-05 ENCOUNTER — Ambulatory Visit: Payer: Medicaid Other | Attending: Internal Medicine | Admitting: Pharmacist

## 2016-04-05 DIAGNOSIS — Z794 Long term (current) use of insulin: Secondary | ICD-10-CM | POA: Diagnosis not present

## 2016-04-05 DIAGNOSIS — E118 Type 2 diabetes mellitus with unspecified complications: Secondary | ICD-10-CM

## 2016-04-05 DIAGNOSIS — IMO0002 Reserved for concepts with insufficient information to code with codable children: Secondary | ICD-10-CM

## 2016-04-05 DIAGNOSIS — E1165 Type 2 diabetes mellitus with hyperglycemia: Secondary | ICD-10-CM

## 2016-04-05 MED ORDER — SERTRALINE HCL 50 MG PO TABS
50.0000 mg | ORAL_TABLET | Freq: Every day | ORAL | 2 refills | Status: DC
Start: 2016-04-05 — End: 2016-08-02

## 2016-04-05 MED ORDER — INSULIN GLARGINE 100 UNIT/ML SOLOSTAR PEN: 36.0000 [IU] | PEN_INJECTOR | Freq: Every day | SUBCUTANEOUS | 11 refills | Status: DC

## 2016-04-05 NOTE — Patient Instructions (Addendum)
Thank you for coming to see me!  Decrease Lantus to 36 units  Continue Novolog - remember to always take it with you if you go out to eat!  Come back in 2 weeks

## 2016-04-05 NOTE — Progress Notes (Signed)
S:    Patient arrives in good spirits with a Optometrist. [place] used for duration of visit as well.  Presents for diabetes evaluation, education, and management at the request of Dr. Adrian Blackwater. Patient was referred on 12/06/15.  Patient was last seen by Primary Care Provider on 03/06/16.   Patient reports adherence with medications.  Current diabetes medications include: Lantus 30 units daily and Novolog 7 units before meals.   She reports 1 hypoglycemic even in the morning - a reading in the 60s. She has subjective hypoglycemia with shakiness a different times, typically in the morning or after breakfast but she does not have glucometer readings for these.  She reports that she eats out and often will forget to take her insulin with her and will just take it when she gets home.     O:  Lab Results  Component Value Date   HGBA1C 12.5 02/03/2016   There were no vitals filed for this visit.  Home fastings: 60s-150s POst-prandial/randoms: 100s-300s  A/P: Diabetes longstanding currently UNcontrolled based on A1c of 12.5. Patient reports 1 hypoglycemic events and is able to verbalize appropriate hypoglycemia management plan. Patient reports adherence with medication. Control is suboptimal due to suboptimal use of Novolog and due to hypoglycemia.   Decrease Lantus to 36 units a day as this will hopefully get rid of any lower fasting readings. Continue Novolog 10 units prior to meals. Educated patient to always take her Novolog with her if she goes out to eat and to take it prior to meals. Once fastings are controlled and she has no more hypoglycemia, will focus on the Novolog dose and optimizing that.   Next A1C anticipated September 2017.    Written patient instructions provided.  Total time in face to face counseling 20 minutes.  Follow up with Dr. Adrian Blackwater in 2 weeks or with me if she is unable to schedule an appointment.

## 2016-04-17 ENCOUNTER — Encounter: Payer: Self-pay | Admitting: Podiatry

## 2016-05-04 ENCOUNTER — Ambulatory Visit (INDEPENDENT_AMBULATORY_CARE_PROVIDER_SITE_OTHER): Payer: Medicaid Other

## 2016-05-04 ENCOUNTER — Ambulatory Visit: Payer: Medicaid Other

## 2016-05-04 ENCOUNTER — Ambulatory Visit (HOSPITAL_COMMUNITY)
Admission: EM | Admit: 2016-05-04 | Discharge: 2016-05-04 | Disposition: A | Discharge status: Home / Self Care | Payer: Medicaid Other | Attending: Family Medicine | Admitting: Family Medicine

## 2016-05-04 ENCOUNTER — Encounter (HOSPITAL_COMMUNITY): Payer: Self-pay | Admitting: Family Medicine

## 2016-05-04 DIAGNOSIS — J208 Acute bronchitis due to other specified organisms: Secondary | ICD-10-CM

## 2016-05-04 DIAGNOSIS — R05 Cough: Secondary | ICD-10-CM | POA: Diagnosis not present

## 2016-05-04 MED ORDER — AZITHROMYCIN 250 MG PO TABS: 250.0000 mg | ORAL_TABLET | Freq: Every day | ORAL | 0 refills | Status: DC

## 2016-05-04 MED ORDER — HYDROCODONE-HOMATROPINE 5-1.5 MG/5ML PO SYRP: 5.0000 mL | ORAL_SOLUTION | Freq: Four times a day (QID) | ORAL | 0 refills | Status: DC | PRN

## 2016-05-04 NOTE — ED Provider Notes (Signed)
[facility]    CSN: [id number] Arrival date & time: 05/04/16  1523  First Provider Contact:  First MD Initiated Contact with Patient 05/04/16 1552        History   Chief Complaint Chief Complaint  Patient presents with  . Cough    HPI Brenda Andrews is a 70 y.o. female.   This is 70 year old Hispanic woman who has a week of cough, productive, and associated with low-grade temperature. His gotten worse over the last several days. She has some shortness of breath on exertion, but can sleep supine without problem  Patient has a history of diabetes, chronic kidney disease      Past Medical History:  Diagnosis Date  . Chronic kidney disease 2002  . COLITIS 03/19/2008   Qualifier: Diagnosis of  By: Marland Mcalpine    . Diabetes mellitus 1982  . Hyperlipidemia 2013  . IBS (irritable bowel syndrome)     Patient Active Problem List   Diagnosis Date Noted  . Slow transit constipation 02/03/2016  . Ingrowing toenail 01/04/2016  . CKD (chronic kidney disease) stage 3, GFR 30-59 ml/min 12/06/2015  . 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 Varicose veins of both lower extremities with complications 00/86/7619  . Seasonal allergies 05/30/2015  . Poor vision 03/10/2015  . Onychomycosis 12/14/2014  . Vitamin D deficiency 10/17/2014  . Orthostatic dizziness 10/15/2014  . Right hand pain 10/15/2014  . Numbness of fingers of both hands 10/15/2014  . Depression 09/07/2014  . DM (diabetes mellitus), type 2, uncontrolled ([place]) 03/19/2008  . Anemia 03/19/2008  . ARTHRITIS 03/19/2008  . OSTEOPOROSIS 03/19/2008    History reviewed. No pertinent surgical history.  OB History    Gravida Para Term Preterm AB Living   7 7 7     7    SAB TAB Ectopic Multiple Live Births                   Home Medications    Prior to Admission medications   Medication Sig Start Date End Date Taking? Authorizing Provider  ACCU-CHEK AVIVA PLUS  test strip USE AS DIRECTED TO TEST BLOOD SUGAR THREE TIMES DAILY 03/26/16   Boykin Nearing, MD  ACCU-CHEK FASTCLIX LANCETS MISC Used 3 time per day/before meals and at bed time 04/28/15   Boykin Nearing, MD  acyclovir (ZOVIRAX) 400 MG tablet Take 1 tablet (400 mg total) by mouth 3 (three) times daily. 02/03/16   Josalyn Funches, MD  atorvastatin (LIPITOR) 40 MG tablet TAKE 1 TABLET BY MOUTH DAILY Patient not taking: Reported on 04/05/2016 12/06/15   Boykin Nearing, MD  azithromycin (ZITHROMAX) 250 MG tablet Take 1 tablet (250 mg total) by mouth daily. Take first 2 tablets together, then 1 every day until finished. 05/04/16   Robyn Haber, MD  Blood Glucose Monitoring Suppl (ACCU-CHEK AVIVA PLUS) w/Device KIT Use as directed - E11.65 01/03/16   Boykin Nearing, MD  gabapentin (NEURONTIN) 300 MG capsule Take 1 capsule (300 mg total) by mouth at bedtime. 03/06/16   Josalyn Funches, MD  HYDROcodone-homatropine (HYDROMET) 5-1.5 MG/5ML syrup Take 5 mLs by mouth every 6 (six) hours as needed for cough. 05/04/16   Robyn Haber, MD  insulin aspart (NOVOLOG FLEXPEN) 100 UNIT/ML FlexPen Inject 7 Units into the skin 3 (three) times daily with meals. 03/06/16   Josalyn Funches, MD  Insulin Glargine (LANTUS SOLOSTAR) 100 UNIT/ML Solostar Pen Inject 36 Units into the skin daily at 10 pm. 04/05/16   Olugbemiga  Essie Christine, MD  Insulin Pen Needle (B-D ULTRAFINE III SHORT PEN) 31G X 8 MM MISC 1 application by Does not apply route 4 (four) times daily. 02/04/16   Josalyn Funches, MD  polyethylene glycol powder (GLYCOLAX/MIRALAX) powder Take 17 g by mouth daily as needed for moderate constipation. 02/03/16   Josalyn Funches, MD  sertraline (ZOLOFT) 50 MG tablet Take 1 tablet (50 mg total) by mouth daily. Reported on 12/06/2015 04/05/16   Boykin Nearing, MD    Family History Family History  Problem Relation Age of Onset  . Diabetes Brother   . Diabetes Brother   . Diabetes Brother   . Diabetes Brother     Social  History Social History  Substance Use Topics  . Smoking status: Never Smoker  . Smokeless tobacco: Never Used  . Alcohol use No     Allergies   Pollen extract-tree extract and Sulfonamide derivatives   Review of Systems Review of Systems  Constitutional: Positive for fatigue and fever.  HENT: Negative for congestion, ear pain, sinus pressure and sore throat.   Eyes: Negative.   Respiratory: Positive for cough, chest tightness, shortness of breath and wheezing.   Cardiovascular: Negative.   Gastrointestinal: Negative.   Genitourinary: Negative.   Neurological: Negative.      Physical Exam Triage Vital Signs ED Triage Vitals  Enc Vitals Group     BP 05/04/16 1538 128/71     Pulse Rate 05/04/16 1538 98     Resp 05/04/16 1538 20     Temp 05/04/16 1538 97.9 F (36.6 C)     Temp src --      SpO2 05/04/16 1538 96 %     Weight --      Height --      Head Circumference --      Peak Flow --      Pain Score 05/04/16 1539 4     Pain Loc --      Pain Edu? --      Excl. in [place]? --    No data found.   Updated Vital Signs BP 128/71   Pulse 98   Temp 97.9 F (36.6 C)   Resp 20   SpO2 96%   Visual Acuity Right Eye Distance:   Left Eye Distance:   Bilateral Distance:    Right Eye Near:   Left Eye Near:    Bilateral Near:     Physical Exam  Constitutional: She is oriented to person, place, and time. She appears well-developed and well-nourished.  HENT:  Mouth/Throat: Oropharynx is clear and moist.  Eyes: Conjunctivae are normal. Pupils are equal, round, and reactive to light.  Neck: Normal range of motion. Neck supple.  Cardiovascular: Normal rate, regular rhythm and normal heart sounds.   No murmur heard. Pulmonary/Chest: Effort normal. She has rales.  Musculoskeletal: Normal range of motion.  Neurological: She is alert and oriented to person, place, and time.  Skin: Skin is warm and dry.  Nursing note and vitals reviewed.    UC Treatments / Results   Labs (all labs ordered are listed, but only abnormal results are displayed) Labs Reviewed - No data to display  EKG  EKG Interpretation None       Radiology Dg Chest 2 View  Result Date: 05/04/2016 CLINICAL DATA:  Productive cough for 1 week, initial encounter EXAM: CHEST  2 VIEW COMPARISON:  08/25/2010 FINDINGS: Cardiac shadow is stable. The lungs are well aerated bilaterally. Old healed rib fractures are again  noted on the right. Chronic changes in the right shoulder joint are noted. No acute abnormality seen. IMPRESSION: No active cardiopulmonary disease. Electronically Signed   By: Inez Catalina M.D.   On: 05/04/2016 16:07    Procedures Procedures (including critical care time)  Medications Ordered in UC Medications - No data to display   Initial Impression / Assessment and Plan / UC Course  I have reviewed the triage vital signs and the nursing notes.  Pertinent labs & imaging results that were available during my care of the patient were reviewed by me and considered in my medical decision making (see chart for details).  Clinical Course      Final Clinical Impressions(s) / UC Diagnoses   Final diagnoses:  Acute bronchitis due to other specified organisms    New Prescriptions New Prescriptions   AZITHROMYCIN (ZITHROMAX) 250 MG TABLET    Take 1 tablet (250 mg total) by mouth daily. Take first 2 tablets together, then 1 every day until finished.   HYDROCODONE-HOMATROPINE (HYDROMET) 5-1.5 MG/5ML SYRUP    Take 5 mLs by mouth every 6 (six) hours as needed for cough.     Robyn Haber, MD 05/04/16 [phone]

## 2016-05-04 NOTE — ED Triage Notes (Signed)
Pt here for productive cough x 1 week. Not using any otc meds.

## 2016-05-06 ENCOUNTER — Other Ambulatory Visit: Payer: Self-pay | Admitting: Family Medicine

## 2016-05-06 ENCOUNTER — Emergency Department (HOSPITAL_COMMUNITY)
Admission: EM | Admit: 2016-05-06 | Discharge: 2016-05-06 | Disposition: A | Discharge status: Home / Self Care | Payer: Medicare Other | Attending: Emergency Medicine | Admitting: Emergency Medicine

## 2016-05-06 ENCOUNTER — Encounter (HOSPITAL_COMMUNITY): Payer: Self-pay

## 2016-05-06 DIAGNOSIS — R300 Dysuria: Secondary | ICD-10-CM | POA: Diagnosis present

## 2016-05-06 DIAGNOSIS — N183 Chronic kidney disease, stage 3 (moderate): Secondary | ICD-10-CM | POA: Insufficient documentation

## 2016-05-06 DIAGNOSIS — E1122 Type 2 diabetes mellitus with diabetic chronic kidney disease: Secondary | ICD-10-CM | POA: Diagnosis not present

## 2016-05-06 DIAGNOSIS — Z794 Long term (current) use of insulin: Secondary | ICD-10-CM | POA: Insufficient documentation

## 2016-05-06 DIAGNOSIS — N390 Urinary tract infection, site not specified: Secondary | ICD-10-CM | POA: Diagnosis not present

## 2016-05-06 DIAGNOSIS — Z79899 Other long term (current) drug therapy: Secondary | ICD-10-CM | POA: Diagnosis not present

## 2016-05-06 LAB — URINALYSIS, ROUTINE W REFLEX MICROSCOPIC
Bilirubin Urine: NEGATIVE
GLUCOSE, UA: NEGATIVE mg/dL
Ketones, ur: NEGATIVE mg/dL
NITRITE: NEGATIVE
PH: 5.5 (ref 5.0–8.0)
Protein, ur: 30 mg/dL — AB
SPECIFIC GRAVITY, URINE: 1.019 (ref 1.005–1.030)

## 2016-05-06 LAB — URINE MICROSCOPIC-ADD ON: RBC / HPF: NONE SEEN RBC/hpf (ref 0–5)

## 2016-05-06 MED ORDER — CEPHALEXIN 500 MG PO CAPS: 500.0000 mg | ORAL_CAPSULE | Freq: Two times a day (BID) | ORAL | 0 refills | Status: DC

## 2016-05-06 MED ORDER — CEPHALEXIN 250 MG PO CAPS
500.0000 mg | ORAL_CAPSULE | Freq: Once | ORAL | Status: AC
  Administered 2016-05-06: 500 mg via ORAL
  Filled 2016-05-06: qty 2

## 2016-05-06 NOTE — ED Notes (Signed)
Pt complains of painful urination with the urge/pressure to urinate without producing much urine.  Lower back/flank pain on both sides. Denies N/V/D. Hx of UTIs. Patient on Z-Pack for bronchitis with 2 more days left of treatment. Denies chest pain and SOB.

## 2016-05-06 NOTE — ED Notes (Signed)
Pt did not urinate enough first time for urine speciman. Have given patient diet ginger ale to help hydrate in order for patient to give another sample.

## 2016-05-06 NOTE — ED Provider Notes (Signed)
[facility] DEPT Provider Note   CSN: [id number] Arrival date & time: 05/06/16  1013     History   Chief Complaint Chief Complaint  Patient presents with  . Dysuria    HPI Brenda Andrews is a 70 y.o. female.  The history is provided by the patient.  Dysuria   This is a new problem. The current episode started yesterday. The problem occurs every urination. The problem has been gradually worsening. The quality of the pain is described as burning, aching and shooting. The pain is at a severity of 6/10. The pain is moderate. There has been no fever. She is not sexually active. There is no history of pyelonephritis. Associated symptoms include chills, frequency and urgency. Pertinent negatives include no nausea, no vomiting and no discharge. She has tried nothing for the symptoms. Her past medical history is significant for recurrent UTIs. Past medical history comments: last UTI 5 years ago..    Past Medical History:  Diagnosis Date  . Chronic kidney disease 2002  . COLITIS 03/19/2008   Qualifier: Diagnosis of  By: Marland Mcalpine    . Diabetes mellitus 1982  . Hyperlipidemia 2013  . IBS (irritable bowel syndrome)     Patient Active Problem List   Diagnosis Date Noted  . Slow transit constipation 02/03/2016  . Ingrowing toenail 01/04/2016  . CKD (chronic kidney disease) stage 3, GFR 30-59 ml/min 12/06/2015  . 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 Varicose veins of both lower extremities with complications 63/81/7711  . Seasonal allergies 05/30/2015  . Poor vision 03/10/2015  . Onychomycosis 12/14/2014  . Vitamin D deficiency 10/17/2014  . Orthostatic dizziness 10/15/2014  . Right hand pain 10/15/2014  . Numbness of fingers of both hands 10/15/2014  . Depression 09/07/2014  . DM (diabetes mellitus), type 2, uncontrolled ([place]) 03/19/2008  . Anemia 03/19/2008  . ARTHRITIS 03/19/2008  . OSTEOPOROSIS 03/19/2008    History reviewed.  No pertinent surgical history.  OB History    Gravida Para Term Preterm AB Living   7 7 7     7    SAB TAB Ectopic Multiple Live Births                   Home Medications    Prior to Admission medications   Medication Sig Start Date End Date Taking? Authorizing Provider  ACCU-CHEK AVIVA PLUS test strip USE AS DIRECTED TO TEST BLOOD SUGAR THREE TIMES DAILY 03/26/16   Boykin Nearing, MD  ACCU-CHEK FASTCLIX LANCETS MISC Used 3 time per day/before meals and at bed time 04/28/15   Boykin Nearing, MD  acyclovir (ZOVIRAX) 400 MG tablet Take 1 tablet (400 mg total) by mouth 3 (three) times daily. 02/03/16   Josalyn Funches, MD  atorvastatin (LIPITOR) 40 MG tablet TAKE 1 TABLET BY MOUTH DAILY Patient not taking: Reported on 04/05/2016 12/06/15   Boykin Nearing, MD  azithromycin (ZITHROMAX) 250 MG tablet Take 1 tablet (250 mg total) by mouth daily. Take first 2 tablets together, then 1 every day until finished. 05/04/16   Robyn Haber, MD  Blood Glucose Monitoring Suppl (ACCU-CHEK AVIVA PLUS) w/Device KIT Use as directed - E11.65 01/03/16   Boykin Nearing, MD  gabapentin (NEURONTIN) 300 MG capsule Take 1 capsule (300 mg total) by mouth at bedtime. 03/06/16   Josalyn Funches, MD  HYDROcodone-homatropine (HYDROMET) 5-1.5 MG/5ML syrup Take 5 mLs by mouth every 6 (six) hours as needed for cough. 05/04/16   Robyn Haber, MD  insulin aspart (  NOVOLOG FLEXPEN) 100 UNIT/ML FlexPen Inject 7 Units into the skin 3 (three) times daily with meals. 03/06/16   Josalyn Funches, MD  Insulin Glargine (LANTUS SOLOSTAR) 100 UNIT/ML Solostar Pen Inject 36 Units into the skin daily at 10 pm. 04/05/16   Tresa Garter, MD  Insulin Pen Needle (B-D ULTRAFINE III SHORT PEN) 31G X 8 MM MISC 1 application by Does not apply route 4 (four) times daily. 02/04/16   Josalyn Funches, MD  polyethylene glycol powder (GLYCOLAX/MIRALAX) powder Take 17 g by mouth daily as needed for moderate constipation. 02/03/16   Josalyn Funches, MD    sertraline (ZOLOFT) 50 MG tablet Take 1 tablet (50 mg total) by mouth daily. Reported on 12/06/2015 04/05/16   Boykin Nearing, MD    Family History Family History  Problem Relation Age of Onset  . Diabetes Brother   . Diabetes Brother   . Diabetes Brother   . Diabetes Brother     Social History Social History  Substance Use Topics  . Smoking status: Never Smoker  . Smokeless tobacco: Never Used  . Alcohol use No     Allergies   Pollen extract-tree extract and Sulfonamide derivatives   Review of Systems Review of Systems  Constitutional: Positive for chills.  Respiratory:       Recent URI with cough and currently on z-pack for bronchitis  Gastrointestinal: Negative for nausea and vomiting.  Genitourinary: Positive for dysuria, frequency and urgency.  All other systems reviewed and are negative.    Physical Exam Updated Vital Signs BP 124/59 (BP Location: Left Arm)   Pulse 93   Temp 98.5 F (36.9 C) (Oral)   Resp 17   Ht 5' (1.524 m)   Wt 130 lb (59 kg)   SpO2 98%   BMI 25.39 kg/m   Physical Exam  Constitutional: She is oriented to person, place, and time. She appears well-developed and well-nourished. No distress.  HENT:  Head: Normocephalic and atraumatic.  Mouth/Throat: Oropharynx is clear and moist.  Eyes: Conjunctivae and EOM are normal. Pupils are equal, round, and reactive to light.  Neck: Normal range of motion. Neck supple.  Cardiovascular: Normal rate, regular rhythm and intact distal pulses.   No murmur heard. Pulmonary/Chest: Effort normal and breath sounds normal. No respiratory distress. She has no wheezes. She has no rales.  Abdominal: Soft. She exhibits no distension. There is tenderness in the suprapubic area. There is no rebound and no guarding.  No significant CVA tenderness but mild lumbar tenderness  Musculoskeletal: Normal range of motion. She exhibits no edema or tenderness.  Neurological: She is alert and oriented to person, place,  and time.  Skin: Skin is warm and dry. No rash noted. No erythema.  Psychiatric: She has a normal mood and affect. Her behavior is normal.  Nursing note and vitals reviewed.    ED Treatments / Results  Labs (all labs ordered are listed, but only abnormal results are displayed) Labs Reviewed  URINALYSIS, ROUTINE W REFLEX MICROSCOPIC (NOT AT [facility]) - Abnormal; Notable for the following:       Result Value   APPearance CLOUDY (*)    Hgb urine dipstick SMALL (*)    Protein, ur 30 (*)    Leukocytes, UA LARGE (*)    All other components within normal limits  URINE MICROSCOPIC-ADD ON - Abnormal; Notable for the following:    Squamous Epithelial / LPF 0-5 (*)    Bacteria, UA FEW (*)    All other components within  normal limits  URINE CULTURE    EKG  EKG Interpretation None       Radiology Dg Chest 2 View  Result Date: 05/04/2016 CLINICAL DATA:  Productive cough for 1 week, initial encounter EXAM: CHEST  2 VIEW COMPARISON:  08/25/2010 FINDINGS: Cardiac shadow is stable. The lungs are well aerated bilaterally. Old healed rib fractures are again noted on the right. Chronic changes in the right shoulder joint are noted. No acute abnormality seen. IMPRESSION: No active cardiopulmonary disease. Electronically Signed   By: Inez Catalina M.D.   On: 05/04/2016 16:07    Procedures Procedures (including critical care time)  Medications Ordered in ED Medications - No data to display   Initial Impression / Assessment and Plan / ED Course  I have reviewed the triage vital signs and the nursing notes.  Pertinent labs & imaging results that were available during my care of the patient were reviewed by me and considered in my medical decision making (see chart for details).  Clinical Course   Patient presenting with 24 hours of dysuria, frequency, urgency, suprapubic pain radiating into her back. She has a history of UTIs last urinary tract infection was 5 years ago. She denies any symptoms  of raised or vaginal discharge. She denies any prior history of kidney stones. UA pending  12:50 PM UA positive for UTI.  Pt treated with keflex and culture is pending. Final Clinical Impressions(s) / ED Diagnoses   Final diagnoses:  UTI (lower urinary tract infection)    New Prescriptions New Prescriptions   CEPHALEXIN (KEFLEX) 500 MG CAPSULE    Take 1 capsule (500 mg total) by mouth 2 (two) times daily.     Blanchie Dessert, MD 05/06/16 1253

## 2016-05-06 NOTE — ED Triage Notes (Signed)
Patient complains of dysuria, frequency and bladder pain x 1 day, hx of uti's, NAD

## 2016-05-08 LAB — URINE CULTURE: Culture: >=100000 — AB

## 2016-05-09 ENCOUNTER — Telehealth (HOSPITAL_BASED_OUTPATIENT_CLINIC_OR_DEPARTMENT_OTHER): Payer: Self-pay

## 2016-05-09 NOTE — Telephone Encounter (Signed)
Post ED Visit - Positive Culture Follow-up  Culture report reviewed by antimicrobial stewardship pharmacist:  []  Elenor Quinones, Pharm.D. []  Heide Guile, Pharm.D., BCPS []  Parks Neptune, Pharm.D. []  Alycia Rossetti, Pharm.D., BCPS []  [place], [place].D., BCPS, AAHIVP []  Legrand Como, Pharm.D., BCPS, AAHIVP []  Milus Glazier, Pharm.D. []  Stephens November, Pharm.DUvaldo Bristle Pharm D Positive urine culture Treated with Cephalexin, organism sensitive to the same and no further patient follow-up is required at this time.  Genia Del 05/09/2016, 8:40 AM

## 2016-05-10 ENCOUNTER — Telehealth: Payer: Self-pay | Admitting: Family Medicine

## 2016-05-10 DIAGNOSIS — J209 Acute bronchitis, unspecified: Secondary | ICD-10-CM

## 2016-05-10 DIAGNOSIS — B9620 Unspecified Escherichia coli [E. coli] as the cause of diseases classified elsewhere: Secondary | ICD-10-CM

## 2016-05-10 DIAGNOSIS — N390 Urinary tract infection, site not specified: Principal | ICD-10-CM

## 2016-05-10 NOTE — Telephone Encounter (Signed)
Brenda Andrews, care giver, called the office to speak with nurse. Brenda Andrews stated that pt had a UTI and went to Urgent Care during the weekend. She was given an antibiotic and due to that and she has been having diarrhea for the past two days. Should she stop taking medication. Also, pt had bronchitis and was taking antibiotics for that too but ED doc told her to stop since she was given new meds for UTI. She continues to have persistent cough. What should she do? Please follow up.  Thank you.

## 2016-05-11 MED ORDER — LEVOFLOXACIN 750 MG PO TABS: 750.0000 mg | ORAL_TABLET | Freq: Every day | ORAL | 0 refills | Status: DC

## 2016-05-11 NOTE — Telephone Encounter (Signed)
Patient sensitive E. Coli UTI Since keflex causing diarrhea Stop keflex Agree with stopping azithromycin Start levaquin 750 mg once daily  to cover UTI and bronchitis

## 2016-05-11 NOTE — Telephone Encounter (Signed)
Will route to PCP 

## 2016-05-14 ENCOUNTER — Other Ambulatory Visit: Payer: Self-pay | Admitting: Family Medicine

## 2016-05-14 DIAGNOSIS — Z1231 Encounter for screening mammogram for malignant neoplasm of breast: Secondary | ICD-10-CM

## 2016-05-18 ENCOUNTER — Encounter: Payer: Self-pay | Admitting: Family Medicine

## 2016-05-18 ENCOUNTER — Ambulatory Visit: Payer: Medicare Other | Attending: Family Medicine | Admitting: Family Medicine

## 2016-05-18 VITALS — BP 114/67 | HR 84 | Temp 98.1°F | Ht 60.0 in | Wt 140.4 lb

## 2016-05-18 DIAGNOSIS — L0202 Furuncle of face: Secondary | ICD-10-CM | POA: Insufficient documentation

## 2016-05-18 DIAGNOSIS — R21 Rash and other nonspecific skin eruption: Secondary | ICD-10-CM | POA: Diagnosis not present

## 2016-05-18 DIAGNOSIS — E1165 Type 2 diabetes mellitus with hyperglycemia: Secondary | ICD-10-CM | POA: Diagnosis not present

## 2016-05-18 DIAGNOSIS — Z794 Long term (current) use of insulin: Secondary | ICD-10-CM | POA: Insufficient documentation

## 2016-05-18 DIAGNOSIS — IMO0002 Reserved for concepts with insufficient information to code with codable children: Secondary | ICD-10-CM

## 2016-05-18 DIAGNOSIS — E118 Type 2 diabetes mellitus with unspecified complications: Secondary | ICD-10-CM

## 2016-05-18 DIAGNOSIS — Z23 Encounter for immunization: Secondary | ICD-10-CM | POA: Insufficient documentation

## 2016-05-18 DIAGNOSIS — J209 Acute bronchitis, unspecified: Secondary | ICD-10-CM | POA: Diagnosis not present

## 2016-05-18 LAB — GLUCOSE, POCT (MANUAL RESULT ENTRY): POC GLUCOSE: 206 mg/dL — AB (ref 70–99)

## 2016-05-18 LAB — POCT GLYCOSYLATED HEMOGLOBIN (HGB A1C): Hemoglobin A1C: 8.4

## 2016-05-18 MED ORDER — BENZONATATE 100 MG PO CAPS: 100.0000 mg | ORAL_CAPSULE | Freq: Three times a day (TID) | ORAL | 0 refills | Status: DC | PRN

## 2016-05-18 MED ORDER — INSULIN GLARGINE 100 UNIT/ML SOLOSTAR PEN: 40.0000 [IU] | PEN_INJECTOR | Freq: Every day | SUBCUTANEOUS | 11 refills | Status: DC

## 2016-05-18 NOTE — Assessment & Plan Note (Signed)
Recent acute bronchitis Normal lung exam  Plan: mucinex for congestion Tessalon perles for cough

## 2016-05-18 NOTE — Assessment & Plan Note (Signed)
A: greatly improved with the addition of insulin P: Increase lantus to 40 U nightly Continue novolog 7 U TID with meals

## 2016-05-18 NOTE — Patient Instructions (Addendum)
Brenda Andrews was seen today for diabetes.  Diagnoses and all orders for this visit:  Uncontrolled type 2 diabetes mellitus with complication, with long-term current use of insulin (HCC) -     POCT glucose (manual entry) -     POCT glycosylated hemoglobin (Hb A1C) -     Insulin Glargine (LANTUS SOLOSTAR) 100 UNIT/ML Solostar Pen; Inject 40 Units into the skin daily at 10 pm.  Acute bronchitis, unspecified organism -     benzonatate (TESSALON PERLES) 100 MG capsule; Take 1 capsule (100 mg total) by mouth 3 (three) times daily as needed for cough.  mucinex for congestion this is over the counter  tesslon perles for cough   Your A1c has improved to 8.5, goal is 8.0 Increase lantus to 40 U  You may proceed with podiatry work  F/u in 4 weeks for wellness physical   Dr. Adrian Blackwater

## 2016-05-18 NOTE — Progress Notes (Signed)
C/C: diabetes follow up, pt is having back and chest pain  Pain level is 8  Pt is getting flu shot today, and wants pneumo shot also

## 2016-05-18 NOTE — Progress Notes (Signed)
Subjective:  Patient ID: Brenda Andrews, female    DOB: [birth year]  Age: 70 y.o. MRN: [id number]  CC: Diabetes   HPI Brenda Andrews presents for her friend  Brenda Andrews   1. CHRONIC DIABETES  Disease Monitoring  Blood Sugar Ranges: 65 fasting -310 in evening   Polyuria: no   Visual problems: no   Medication Compliance: yes, lantus 36 U, novolog 7 U TID  Medication Side Effects  Hypoglycemia: 65 once fasting  She is eating low carb overall. She does minimal exercise     2. Rash: x 4 years. On L side under breast and L upper back/flank. Also with L forehead boil. She went to dermatology. Was tried on doxycycline x 2 which resulted in diarrhea. She completed a course of clindamycin. She went to ED on 11/07/15 for same problem and was advised to continue clindamycin. 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 Her rash has improved with improvement in her CBGs. She no longer has pain.   3. UC f/u bronchitis: she was seen on 05/04/16, CXR was negative. She still has some cough, but this is improving. Cough is no longer productive. She still has pain in her chest and back. She still feels congested in her chest with little shortness.   4. UC f/u UTI: denies dysuria. She took 3 of the Levaquin, but started having diarrhea and stopped taking it. Diarrhea resolved yesterday,   Social History  Substance Use Topics  . Smoking status: Never Smoker  . Smokeless tobacco: Never Used  . Alcohol use No    Outpatient Medications Prior to Visit  Medication Sig Dispense Refill  . ACCU-CHEK AVIVA PLUS test strip USE AS DIRECTED TO TEST BLOOD SUGAR THREE TIMES DAILY 100 each 5  . ACCU-CHEK FASTCLIX LANCETS MISC USE 3 TIMES PER DAY BEFORE MEALS AND EVERY NIGHT AT BEDTIME 102 each 5  . Blood Glucose Monitoring Suppl (ACCU-CHEK AVIVA PLUS) w/Device KIT Use as directed - E11.65 1 kit 0  . gabapentin (NEURONTIN) 300 MG capsule Take 1 capsule (300 mg total) by mouth at  bedtime. 30 capsule 5  . insulin aspart (NOVOLOG FLEXPEN) 100 UNIT/ML FlexPen Inject 7 Units into the skin 3 (three) times daily with meals. 15 mL 11  . Insulin Glargine (LANTUS SOLOSTAR) 100 UNIT/ML Solostar Pen Inject 36 Units into the skin daily at 10 pm. 5 pen 11  . Insulin Pen Needle (B-D ULTRAFINE III SHORT PEN) 31G X 8 MM MISC 1 application by Does not apply route 4 (four) times daily. 120 each 11  . polyethylene glycol powder (GLYCOLAX/MIRALAX) powder Take 17 g by mouth daily as needed for moderate constipation. 3350 g 1  . sertraline (ZOLOFT) 50 MG tablet Take 1 tablet (50 mg total) by mouth daily. Reported on 12/06/2015 30 tablet 2  . acyclovir (ZOVIRAX) 400 MG tablet Take 1 tablet (400 mg total) by mouth 3 (three) times daily. (Patient not taking: Reported on 05/18/2016) 15 tablet 5  . atorvastatin (LIPITOR) 40 MG tablet TAKE 1 TABLET BY MOUTH DAILY (Patient not taking: Reported on 05/18/2016) 90 tablet 0  . cephALEXin (KEFLEX) 500 MG capsule Take 1 capsule (500 mg total) by mouth 2 (two) times daily. (Patient not taking: Reported on 05/18/2016) 14 capsule 0  . HYDROcodone-homatropine (HYDROMET) 5-1.5 MG/5ML syrup Take 5 mLs by mouth every 6 (six) hours as needed for cough. (Patient not taking: Reported on 05/18/2016) 120 mL 0  . levofloxacin (LEVAQUIN) 750 MG tablet Take  1 tablet (750 mg total) by mouth daily. (Patient not taking: Reported on 05/18/2016) 5 tablet 0   No facility-administered medications prior to visit.     ROS Review of Systems  Constitutional: Negative for chills and fever.  Eyes: Negative for visual disturbance.  Respiratory: Positive for cough. Negative for shortness of breath.   Cardiovascular: Negative for chest pain.  Gastrointestinal: Negative for abdominal pain, blood in stool and constipation.  Genitourinary: Negative for dysuria, frequency and hematuria.  Musculoskeletal: Negative for arthralgias, back pain and myalgias.  Skin: Positive for color change.  Negative for rash.  Allergic/Immunologic: Negative for immunocompromised state.  Hematological: Negative for adenopathy. Does not bruise/bleed easily.  Psychiatric/Behavioral: Negative for dysphoric mood and suicidal ideas.    Objective:  BP 114/67 (BP Location: Right Arm, Patient Position: Sitting, Cuff Size: Small)   Pulse 84   Temp 98.1 F (36.7 C) (Oral)   Ht 5' (1.524 m)   Wt 140 lb 6.4 oz (63.7 kg)   SpO2 98%   BMI 27.42 kg/m   BP/Weight 05/18/2016 4/00/8676 09/05/5091  Systolic BP 267 124 580  Diastolic BP 67 80 71  Wt. (Lbs) 140.4 130 -  BMI 27.42 25.39 -   Physical Exam  Constitutional: She is oriented to person, place, and time. She appears well-developed and well-nourished. No distress.  HENT:  Head: Normocephalic and atraumatic.  Cardiovascular: Normal rate, regular rhythm, normal heart sounds and intact distal pulses.   Pulmonary/Chest: Effort normal and breath sounds normal.  Abdominal: Soft. Bowel sounds are normal. She exhibits no distension and no mass. There is no tenderness. There is no rebound and no guarding.  Musculoskeletal: She exhibits no edema.  Neurological: She is alert and oriented to person, place, and time.  Skin: Skin is warm and dry. No erythema.  Hyperpigmented papules on L  flank and under both breast   Psychiatric: She has a normal mood and affect.    Lab Results  Component Value Date   HGBA1C 12.5 02/03/2016   Lab Results  Component Value Date   HGBA1C 8.4 05/18/2016    CBG 206  Assessment & Plan:   Andrienne was seen today for diabetes.  Diagnoses and all orders for this visit:  Uncontrolled type 2 diabetes mellitus with complication, with long-term current use of insulin (HCC) -     POCT glucose (manual entry) -     POCT glycosylated hemoglobin (Hb A1C) -     Insulin Glargine (LANTUS SOLOSTAR) 100 UNIT/ML Solostar Pen; Inject 40 Units into the skin daily at 10 pm.  Acute bronchitis, unspecified organism -     benzonatate  (TESSALON PERLES) 100 MG capsule; Take 1 capsule (100 mg total) by mouth 3 (three) times daily as needed for cough.  Needs flu shot -     Flu Vaccine QUAD 36+ mos IM  Need for pneumococcal vaccination -     Pneumococcal conjugate vaccine 13-valent IM   No orders of the defined types were placed in this encounter.   Follow-up: Return in about 4 weeks (around 06/15/2016) for wellness physical .   Boykin Nearing MD

## 2016-05-25 NOTE — Telephone Encounter (Signed)
Pt. Returned call. Please f/u with pt. °

## 2016-05-25 NOTE — Telephone Encounter (Signed)
LVM to return call.

## 2016-05-29 DIAGNOSIS — N2581 Secondary hyperparathyroidism of renal origin: Secondary | ICD-10-CM | POA: Diagnosis not present

## 2016-05-29 DIAGNOSIS — N183 Chronic kidney disease, stage 3 (moderate): Secondary | ICD-10-CM | POA: Diagnosis not present

## 2016-05-29 DIAGNOSIS — N189 Chronic kidney disease, unspecified: Secondary | ICD-10-CM | POA: Diagnosis not present

## 2016-05-29 DIAGNOSIS — D631 Anemia in chronic kidney disease: Secondary | ICD-10-CM | POA: Diagnosis not present

## 2016-05-29 DIAGNOSIS — Z6828 Body mass index (BMI) 28.0-28.9, adult: Secondary | ICD-10-CM | POA: Diagnosis not present

## 2016-06-06 ENCOUNTER — Other Ambulatory Visit: Payer: Self-pay | Admitting: Family Medicine

## 2016-06-08 ENCOUNTER — Ambulatory Visit (INDEPENDENT_AMBULATORY_CARE_PROVIDER_SITE_OTHER): Payer: Medicare Other | Admitting: Podiatry

## 2016-06-08 ENCOUNTER — Encounter: Payer: Self-pay | Admitting: Podiatry

## 2016-06-08 ENCOUNTER — Telehealth: Payer: Self-pay | Admitting: *Deleted

## 2016-06-08 DIAGNOSIS — L600 Ingrowing nail: Secondary | ICD-10-CM

## 2016-06-08 MED ORDER — CEPHALEXIN 500 MG PO CAPS: 500.0000 mg | ORAL_CAPSULE | Freq: Three times a day (TID) | ORAL | 2 refills | Status: DC

## 2016-06-08 NOTE — Progress Notes (Signed)
Subjective: 70 year old female presents the office today requesting to have a procedure done to both of her big toenails as they are ingrown on both nail borders on both big toes. She states her blood sugars been much better controlled her A1c is down 8.4. She denies any redness or drainage or any swelling on the toenail sites however the area is painful pressure in shoe gear. We been trimming the toenails out in the heel for some minimal temporary relief however did discontinue become painful. At this time she is requesting a procedure to have the nail corners removed. She denies any claudication symptoms. Denies any systemic complaints such as fevers, chills, nausea, vomiting. No acute changes since last appointment, and no other complaints at this time.   Objective: AAO x3, NAD DP/PT pulses palpable bilaterally 2/4, CRT less than 3 seconds There is incurvation of both the medial and lateral nail borders of bilateral hallux toenails with tenderness palpation. There is minimal edema to the nail borders but there is no subcutaneous erythema or increase in warmth. There is no drainage or pus  expressed. No open lesions or pre-ulcerative lesions.  No pain with calf compression, swelling, warmth, erythema  Assessment: Bilateral hallux is not ingrown toenails  Plan: -All treatment options discussed with the patient including all alternatives, risks, complications.  -At this time a discussed the conservative and surgical intervention for the ingrown toenails. At this time she wasn't have a toenail avulsion performed. Discussed the risks of doing this procedure including nonhealing, infection, amputation of the toe, reoccurrence, thickening or discolored nail of further nail abnormality. She understands this wishes to proceed. -At this time, the patient is requesting partial nail removal with chemical matricectomy to the symptomatic portion of the nail. Risks and complications were discussed with the  patient for which they understand and  verbally consent to the procedure. Under sterile conditions a total of 3 mL of a mixture of 2% lidocaine plain and 0.5% Marcaine plain was infiltrated in a hallux block fashion. Once anesthetized, the skin was prepped in sterile fashion. A tourniquet was then applied. Next the medial and lateral aspect of hallux nail border was then sharply excised making sure to remove the entire offending nail border. Once the nails were ensured to be removed area was debrided and the underlying skin was intact. There is no purulence identified in the procedure. Next phenol was then applied under standard conditions and copiously irrigated. Silvadene was applied. A dry sterile dressing was applied. After application of the dressing the tourniquet was removed and there is found to be an immediate capillary refill time to the digit. The patient tolerated the procedure well any complications. Post procedure instructions were discussed the patient for which he verbally understood. Follow-up in one week for nail check or sooner if any problems are to arise. Discussed signs/symptoms of infection and directed to call the office immediately should any occur or go directly to the emergency room. In the meantime, encouraged to call the office with any questions, concerns, changes symptoms. -keflex -Patient encouraged to call the office with any questions, concerns, change in symptoms.   Brenda Andrews, DPM

## 2016-06-08 NOTE — Patient Instructions (Signed)

## 2016-06-08 NOTE — Telephone Encounter (Signed)
[name] states pt had toe surgery today, and would like to know if she needed a pain medication. Dr. Jacqualyn Posey states pt may take OTC Ibuprofen if tolerated as package instructs.  I informed Asencion Partridge of Dr. Leigh Aurora instructions.

## 2016-06-15 ENCOUNTER — Ambulatory Visit (INDEPENDENT_AMBULATORY_CARE_PROVIDER_SITE_OTHER): Payer: Medicare Other | Admitting: Podiatry

## 2016-06-15 DIAGNOSIS — M79675 Pain in left toe(s): Secondary | ICD-10-CM | POA: Diagnosis not present

## 2016-06-15 DIAGNOSIS — L600 Ingrowing nail: Secondary | ICD-10-CM

## 2016-06-15 DIAGNOSIS — M79674 Pain in right toe(s): Secondary | ICD-10-CM | POA: Diagnosis not present

## 2016-06-15 DIAGNOSIS — B351 Tinea unguium: Secondary | ICD-10-CM

## 2016-06-15 NOTE — Patient Instructions (Signed)

## 2016-06-20 ENCOUNTER — Ambulatory Visit
Admission: RE | Admit: 2016-06-20 | Discharge: 2016-06-20 | Disposition: A | Discharge status: Home / Self Care | Payer: Medicare Other | Source: Ambulatory Visit | Attending: Family Medicine | Admitting: Family Medicine

## 2016-06-20 DIAGNOSIS — Z1231 Encounter for screening mammogram for malignant neoplasm of breast: Secondary | ICD-10-CM | POA: Diagnosis not present

## 2016-06-22 NOTE — Progress Notes (Signed)
Subjective: Brenda Andrews is a 70 y.o.  female returns to office today for follow up evaluation after having bilateral Hallux partial permanent nail avulsion performed. Patient has been soaking using epsom salts and applying topical antibiotic covered with bandaid daily. She is requesting the remainder of her toenails be trimmed today as they're thick, painful, elongated and she cannot trim them herself. Patient denies fevers, chills, nausea, vomiting. Denies any calf pain, chest pain, SOB.   Objective:  Vitals: Reviewed  General: Well developed, nourished, in no acute distress, alert and oriented x3   Dermatology: Skin is warm, dry and supple bilateral. Bilateral hallux nail border appears to be clean, dry, with mild granular tissue and surrounding scab. There is no surrounding erythema, edema, drainage/purulence. The remainder the nails of lesser digits are dystrophic, discolored, mildly hypertrophic and is tenderness palpation of the nails 2 through 5 bilaterally. No surrounding redness or drainage. There are no other lesions or other signs of infection present.  Neurovascular status: No lower extremity swelling; No pain with calf compression bilateral.  Musculoskeletal: Decreased tenderness to palpation of the bilateral hallux nail folds. Muscular strength within normal limits bilateral.   Assesement and Plan: S/p partial nail avulsion, doing well; symptomatic onychomycosis  -Continue soaking in epsom salts twice a day followed by antibiotic ointment and a band-aid. Can leave uncovered at night. Continue this until completely healed.  -If the area has not healed in 2 weeks, call the office for follow-up appointment, or sooner if any problems arise.  -Remainder the nails debrided times 8 without complications or bleeding. -Monitor for any signs/symptoms of infection. Call the office immediately if any occur or go directly to the emergency room. Call with any  questions/concerns.  Celesta Gentile, DPM

## 2016-06-25 ENCOUNTER — Encounter: Payer: Self-pay | Admitting: Family Medicine

## 2016-06-25 ENCOUNTER — Ambulatory Visit: Payer: Medicare Other | Attending: Family Medicine | Admitting: Family Medicine

## 2016-06-25 VITALS — BP 121/65 | HR 79 | Temp 98.4°F | Ht 60.0 in | Wt 146.0 lb

## 2016-06-25 DIAGNOSIS — E1122 Type 2 diabetes mellitus with diabetic chronic kidney disease: Secondary | ICD-10-CM | POA: Diagnosis not present

## 2016-06-25 DIAGNOSIS — E118 Type 2 diabetes mellitus with unspecified complications: Secondary | ICD-10-CM

## 2016-06-25 DIAGNOSIS — N183 Chronic kidney disease, stage 3 unspecified: Secondary | ICD-10-CM

## 2016-06-25 DIAGNOSIS — N189 Chronic kidney disease, unspecified: Secondary | ICD-10-CM | POA: Diagnosis not present

## 2016-06-25 DIAGNOSIS — K589 Irritable bowel syndrome without diarrhea: Secondary | ICD-10-CM | POA: Insufficient documentation

## 2016-06-25 DIAGNOSIS — Z23 Encounter for immunization: Secondary | ICD-10-CM | POA: Diagnosis not present

## 2016-06-25 DIAGNOSIS — Z0000 Encounter for general adult medical examination without abnormal findings: Secondary | ICD-10-CM

## 2016-06-25 DIAGNOSIS — E2839 Other primary ovarian failure: Secondary | ICD-10-CM

## 2016-06-25 DIAGNOSIS — E1165 Type 2 diabetes mellitus with hyperglycemia: Secondary | ICD-10-CM

## 2016-06-25 DIAGNOSIS — E785 Hyperlipidemia, unspecified: Secondary | ICD-10-CM | POA: Insufficient documentation

## 2016-06-25 DIAGNOSIS — IMO0002 Reserved for concepts with insufficient information to code with codable children: Secondary | ICD-10-CM

## 2016-06-25 DIAGNOSIS — Z794 Long term (current) use of insulin: Secondary | ICD-10-CM | POA: Diagnosis not present

## 2016-06-25 DIAGNOSIS — R1012 Left upper quadrant pain: Secondary | ICD-10-CM

## 2016-06-25 DIAGNOSIS — K5901 Slow transit constipation: Secondary | ICD-10-CM

## 2016-06-25 LAB — CBC
HCT: 35.1 % (ref 35.0–45.0)
HEMOGLOBIN: 11.6 g/dL — AB (ref 11.7–15.5)
MCH: 29.8 pg (ref 27.0–33.0)
MCHC: 33 g/dL (ref 32.0–36.0)
MCV: 90.2 fL (ref 80.0–100.0)
MPV: 10 fL (ref 7.5–12.5)
PLATELETS: 241 10*3/uL (ref 140–400)
RBC: 3.89 MIL/uL (ref 3.80–5.10)
RDW: 14.8 % (ref 11.0–15.0)
WBC: 6.3 10*3/uL (ref 3.8–10.8)

## 2016-06-25 LAB — COMPLETE METABOLIC PANEL WITH GFR
ALBUMIN: 4 g/dL (ref 3.6–5.1)
ALK PHOS: 102 U/L (ref 33–130)
ALT: 14 U/L (ref 6–29)
AST: 17 U/L (ref 10–35)
BILIRUBIN TOTAL: 0.3 mg/dL (ref 0.2–1.2)
BUN: 36 mg/dL — AB (ref 7–25)
CO2: 22 mmol/L (ref 20–31)
CREATININE: 1.44 mg/dL — AB (ref 0.60–0.93)
Calcium: 9.7 mg/dL (ref 8.6–10.4)
Chloride: 109 mmol/L (ref 98–110)
GFR, Est African American: 42 mL/min — ABNORMAL LOW (ref >=60)
GFR, Est Non African American: 37 mL/min — ABNORMAL LOW (ref >=60)
GLUCOSE: 75 mg/dL (ref 65–99)
Potassium: 4 mmol/L (ref 3.5–5.3)
SODIUM: 140 mmol/L (ref 135–146)
TOTAL PROTEIN: 7.4 g/dL (ref 6.1–8.1)

## 2016-06-25 LAB — POCT URINALYSIS DIPSTICK
Bilirubin, UA: NEGATIVE
Blood, UA: NEGATIVE
Glucose, UA: NEGATIVE
LEUKOCYTES UA: NEGATIVE
NITRITE UA: NEGATIVE
PH UA: 5
PROTEIN UA: NEGATIVE
Spec Grav, UA: 1.015
Urobilinogen, UA: 0.2

## 2016-06-25 LAB — HEMOCCULT GUIAC POC 1CARD (OFFICE): FECAL OCCULT BLD: NEGATIVE

## 2016-06-25 LAB — TSH: TSH: 3.33 mIU/L

## 2016-06-25 LAB — LIPASE: Lipase: 85 U/L — ABNORMAL HIGH (ref 7–60)

## 2016-06-25 LAB — GLUCOSE, POCT (MANUAL RESULT ENTRY): POC GLUCOSE: 125 mg/dL — AB (ref 70–99)

## 2016-06-25 MED ORDER — POLYETHYLENE GLYCOL 3350 17 GM/SCOOP PO POWD: 17.0000 g | Freq: Every day | ORAL | 1 refills | Status: DC | PRN

## 2016-06-25 NOTE — Progress Notes (Signed)
SUBJECTIVE:  70 y.o. female for annual routine wellness visit with breast and pelvic exam.   CBG ranges Fasting 88- 140 (221) Post prandial 123-244   Past Medical History:  Diagnosis Date  . Chronic kidney disease 2002  . COLITIS 03/19/2008   Qualifier: Diagnosis of  By: Marland Mcalpine    . Diabetes mellitus 1982  . Hyperlipidemia 2013  . IBS (irritable bowel syndrome)    No past surgical history on file.  Current Outpatient Prescriptions  Medication Sig Dispense Refill  . ACCU-CHEK AVIVA PLUS test strip USE AS DIRECTED TO TEST BLOOD SUGAR THREE TIMES DAILY 100 each 5  . ACCU-CHEK FASTCLIX LANCETS MISC USE 3 TIMES PER DAY BEFORE MEALS AND EVERY NIGHT AT BEDTIME 102 each 5  . acyclovir (ZOVIRAX) 400 MG tablet Take 1 tablet (400 mg total) by mouth 3 (three) times daily. 15 tablet 5  . benzonatate (TESSALON PERLES) 100 MG capsule Take 1 capsule (100 mg total) by mouth 3 (three) times daily as needed for cough. 20 capsule 0  . Blood Glucose Monitoring Suppl (ACCU-CHEK AVIVA PLUS) w/Device KIT Use as directed - E11.65 1 kit 0  . gabapentin (NEURONTIN) 300 MG capsule TAKE ONE CAPSULE BY MOUTH AT BEDTIME 90 capsule 0  . insulin aspart (NOVOLOG FLEXPEN) 100 UNIT/ML FlexPen Inject 7 Units into the skin 3 (three) times daily with meals. 15 mL 11  . Insulin Glargine (LANTUS SOLOSTAR) 100 UNIT/ML Solostar Pen Inject 40 Units into the skin daily at 10 pm. 5 pen 11  . Insulin Pen Needle (B-D ULTRAFINE III SHORT PEN) 31G X 8 MM MISC 1 application by Does not apply route 4 (four) times daily. 120 each 11  . polyethylene glycol powder (GLYCOLAX/MIRALAX) powder Take 17 g by mouth daily as needed for moderate constipation. 3350 g 1  . atorvastatin (LIPITOR) 40 MG tablet TAKE 1 TABLET BY MOUTH DAILY (Patient not taking: Reported on 06/25/2016) 90 tablet 0  . cephALEXin (KEFLEX) 500 MG capsule Take 1 capsule (500 mg total) by mouth 3 (three) times daily. (Patient not taking: Reported on 06/25/2016) 30  capsule 2  . sertraline (ZOLOFT) 50 MG tablet Take 1 tablet (50 mg total) by mouth daily. Reported on 12/06/2015 (Patient not taking: Reported on 06/25/2016) 30 tablet 2   No current facility-administered medications for this visit.    Allergies: Pollen extract-tree extract and Sulfonamide derivatives  No LMP recorded. Patient is postmenopausal.  ROS:  Feeling well. No dyspnea or chest pain on exertion.  LUQ pain. Pain in upper back worse on R. No change in bowel habits, black or bloody stools.  No urinary tract symptoms. GYN ROS: normal menses, no abnormal bleeding, pelvic pain or discharge, no breast pain or new or enlarging lumps on self exam. No neurological complaints.  OBJECTIVE:  The patient appears well, alert, oriented x 3, in no distress. BP 121/65 (BP Location: Right Arm, Patient Position: Sitting, Cuff Size: Small)   Pulse 79   Temp 98.4 F (36.9 C) (Oral)   Ht 5' (1.524 m)   Wt 146 lb (66.2 kg)   SpO2 95%   BMI 28.51 kg/m  ENT normal.  Neck supple. No adenopathy or thyromegaly. PERLA. Lungs are clear, good air entry, no wheezes, rhonchi or rales. S1 and S2 normal, no murmurs, regular rate and rhythm. Abdomen soft without tenderness, guarding, mass or organomegaly. Extremities show no edema, normal peripheral pulses. Neurological is normal, no focal findings.  BREAST EXAM: breasts appear normal, no suspicious masses,  no skin or nipple changes or axillary nodes  PELVIC EXAM: normal external genitalia, vulva, vagina, cervix, uterus and adnexa, RECTAL: normal rectal, no masses, guaiac negative stool obtained, hard stool in rectal vault   Lab Results  Component Value Date   HGBA1C 8.4 05/18/2016   CBG 125  ASSESSMENT:  well woman  PLAN:  F/u in 2 months for diabetes follow up

## 2016-06-25 NOTE — Assessment & Plan Note (Signed)
Newly reported LUQ pain Lipase ordered

## 2016-06-25 NOTE — Patient Instructions (Addendum)
Brenda Andrews was seen today for annual exam.  Diagnoses and all orders for this visit:  Uncontrolled type 2 diabetes mellitus with complication, with long-term current use of insulin (HCC) -     POCT glucose (manual entry) -     Microalbumin/Creatinine Ratio, Urine -     TSH -     Ambulatory referral to Ophthalmology  Encounter for Medicare annual wellness exam -     POCT urinalysis dipstick -     Hemoccult - 1 Card (office)  CKD (chronic kidney disease) stage 3, GFR 30-59 ml/min -     CBC -     COMPLETE METABOLIC PANEL WITH GFR  Abdominal pain, LUQ -     Lipase  Slow transit constipation -     polyethylene glycol powder (GLYCOLAX/MIRALAX) powder; Take 17 g by mouth daily as needed for moderate constipation.  Estrogen deficiency -     DG Bone Density; Future  Other orders -     Tdap vaccine greater than or equal to 7yo IM   No pap done today Normal breast and pelvic exam  You will be called with lab results  F/u in 2 months for diabetes follow up   Dr. Adrian Blackwater

## 2016-06-25 NOTE — Assessment & Plan Note (Signed)
Well-controlled.  Continue current regimen. 

## 2016-06-25 NOTE — Assessment & Plan Note (Signed)
Overall well Dexa scan ordered Patient referred for yearly diabetic eye exam

## 2016-06-25 NOTE — Progress Notes (Signed)
Pt is here today for a physical with pap smear.  Pt is having pain on her left side.  Pt is getting tdap shot today.

## 2016-06-26 LAB — MICROALBUMIN / CREATININE URINE RATIO
CREATININE, URINE: 166 mg/dL (ref 20–320)
MICROALB/CREAT RATIO: 4 ug/mg{creat} (ref <30)
Microalb, Ur: 0.7 mg/dL

## 2016-06-28 DIAGNOSIS — E113532 Type 2 diabetes mellitus with proliferative diabetic retinopathy with traction retinal detachment not involving the macula, left eye: Secondary | ICD-10-CM | POA: Diagnosis not present

## 2016-06-28 DIAGNOSIS — Z961 Presence of intraocular lens: Secondary | ICD-10-CM | POA: Diagnosis not present

## 2016-06-28 DIAGNOSIS — Z794 Long term (current) use of insulin: Secondary | ICD-10-CM | POA: Diagnosis not present

## 2016-06-28 DIAGNOSIS — E113521 Type 2 diabetes mellitus with proliferative diabetic retinopathy with traction retinal detachment involving the macula, right eye: Secondary | ICD-10-CM | POA: Diagnosis not present

## 2016-06-28 DIAGNOSIS — H4720 Unspecified optic atrophy: Secondary | ICD-10-CM | POA: Diagnosis not present

## 2016-06-29 ENCOUNTER — Ambulatory Visit
Admission: RE | Admit: 2016-06-29 | Discharge: 2016-06-29 | Disposition: A | Discharge status: Home / Self Care | Payer: Medicare Other | Source: Ambulatory Visit | Attending: Family Medicine | Admitting: Family Medicine

## 2016-06-29 ENCOUNTER — Encounter: Payer: Self-pay | Admitting: Family Medicine

## 2016-06-29 DIAGNOSIS — Z780 Asymptomatic menopausal state: Secondary | ICD-10-CM | POA: Diagnosis not present

## 2016-06-29 DIAGNOSIS — M8580 Other specified disorders of bone density and structure, unspecified site: Secondary | ICD-10-CM | POA: Insufficient documentation

## 2016-06-29 DIAGNOSIS — E2839 Other primary ovarian failure: Secondary | ICD-10-CM

## 2016-06-29 DIAGNOSIS — M85852 Other specified disorders of bone density and structure, left thigh: Secondary | ICD-10-CM | POA: Diagnosis not present

## 2016-06-29 NOTE — Assessment & Plan Note (Signed)
Bone density with thin bones but not osteoporosis this is called osteopenia Take calcium citrate 400 mg daily and Vit D3 2000 IU daily  to protect bones, these are available over the counter  Also exercise using light weights 3-5 #

## 2016-07-02 ENCOUNTER — Ambulatory Visit (INDEPENDENT_AMBULATORY_CARE_PROVIDER_SITE_OTHER): Payer: Medicare Other | Admitting: Podiatry

## 2016-07-02 ENCOUNTER — Encounter: Payer: Self-pay | Admitting: Podiatry

## 2016-07-02 ENCOUNTER — Telehealth: Payer: Self-pay | Admitting: *Deleted

## 2016-07-02 DIAGNOSIS — B351 Tinea unguium: Secondary | ICD-10-CM

## 2016-07-02 DIAGNOSIS — S90222A Contusion of left lesser toe(s) with damage to nail, initial encounter: Secondary | ICD-10-CM | POA: Diagnosis not present

## 2016-07-02 NOTE — Telephone Encounter (Signed)
-----   Message from Trula Slade, DPM sent at 07/02/2016 11:06 AM EST ----- Can you please put in for a dermatology consult for longitudinal black strip in her right thumb nail? Thanks.

## 2016-07-02 NOTE — Addendum Note (Signed)
Addended by: Boykin Nearing on: 07/02/2016 08:24 PM   Modules accepted: Orders

## 2016-07-03 ENCOUNTER — Telehealth: Payer: Self-pay | Admitting: *Deleted

## 2016-07-03 NOTE — Telephone Encounter (Signed)
Faxed referral, pt clinicals and demographics to Dr. Ledell Peoples office.

## 2016-07-03 NOTE — Progress Notes (Signed)
Subjective: 70 y.o. returns to the office today for concerns of her left hallux toenail become darker since having the ingrown toenail procedure. She denies any pain associated when she denies any black spots to the skin around the toenail. She denies any drainage or pus. She states the procedure sites are healing well otherwise.  She is also developed a black stripe in her thumb nail. She is asking about her to see for this.   Denies any acute changes since last appointment and no new complaints today. Denies any systemic complaints such as fevers, chills, nausea, vomiting.   Objective: AAO 3, NAD; Spanish interpreter used. DP/PT pulses palpable, CRT less than 3 seconds Left hallux toenail with some darkened discoloration of the nail consistent with cephalohematoma. Upon debridement there is no extension of hyperpigmentation into the skin or around the nail. The procedure sites appear to be healed. Small scabs are present the proximal nail borders on the procedure sites. There is no edema, erythema, drainage or pus. No other areas of tenderness bilateral lower extremities. No overlying edema, erythema, increased warmth. No pain with calf compression, swelling, warmth, erythema.  Assessment: Patient presents with left hallux subungual hematoma  Plan: -Treatment options including alternatives, risks, complications were discussed -Left hallux toenail is debrided. Dried blood was removed. The nail appears to be firmly adhered. Discussed that on the toenail grow out. She has over-the-counter medicine for toenail fungus and she is going to continue with this. Discussed oral treatment with her for nail fungus. -She also has a longitudinal stripe and her thumb nail. She will let to be referred for this and I'll refer to dermatology. -Follow up as scheduled or sooner if needed. Call any questions or concerns meantime.  Celesta Gentile, DPM

## 2016-07-04 ENCOUNTER — Telehealth: Payer: Self-pay

## 2016-07-04 NOTE — Telephone Encounter (Signed)
Pt was called and informed of lab results and [place] was scheduled for Nov. 20th.

## 2016-07-16 ENCOUNTER — Ambulatory Visit (HOSPITAL_COMMUNITY)
Admission: RE | Admit: 2016-07-16 | Discharge: 2016-07-16 | Disposition: A | Discharge status: Home / Self Care | Payer: Medicare Other | Source: Ambulatory Visit | Attending: Family Medicine | Admitting: Family Medicine

## 2016-07-16 DIAGNOSIS — R1012 Left upper quadrant pain: Secondary | ICD-10-CM | POA: Diagnosis not present

## 2016-07-16 DIAGNOSIS — N281 Cyst of kidney, acquired: Secondary | ICD-10-CM | POA: Insufficient documentation

## 2016-07-18 ENCOUNTER — Telehealth: Payer: Self-pay

## 2016-07-18 NOTE — Telephone Encounter (Signed)
Pt was called and interpreter informed pt of lab results.

## 2016-07-30 ENCOUNTER — Telehealth: Payer: Self-pay | Admitting: Family Medicine

## 2016-07-30 NOTE — Telephone Encounter (Signed)
Pt's niece came to the office to speak with nurse regarding her medications. She stated that patient wants to use [facility] for her prescriptions and will also need a 90 day supply. [facility]'s number is [phone] and fax [phone]. Pt needs all her medications to be sent over there.   Thank you.

## 2016-08-01 NOTE — Telephone Encounter (Signed)
Will route to PCP 

## 2016-08-02 NOTE — Telephone Encounter (Signed)
Please send a 90 day supply of medications to patient's requested pharmacy

## 2016-08-03 ENCOUNTER — Other Ambulatory Visit: Payer: Self-pay

## 2016-08-03 DIAGNOSIS — Z794 Long term (current) use of insulin: Secondary | ICD-10-CM

## 2016-08-03 DIAGNOSIS — B0089 Other herpesviral infection: Secondary | ICD-10-CM

## 2016-08-03 DIAGNOSIS — IMO0002 Reserved for concepts with insufficient information to code with codable children: Secondary | ICD-10-CM

## 2016-08-03 DIAGNOSIS — E1165 Type 2 diabetes mellitus with hyperglycemia: Secondary | ICD-10-CM

## 2016-08-03 DIAGNOSIS — E118 Type 2 diabetes mellitus with unspecified complications: Secondary | ICD-10-CM

## 2016-08-03 DIAGNOSIS — K5901 Slow transit constipation: Secondary | ICD-10-CM

## 2016-08-03 MED ORDER — INSULIN ASPART 100 UNIT/ML FLEXPEN: 7.0000 [IU] | PEN_INJECTOR | Freq: Three times a day (TID) | SUBCUTANEOUS | 11 refills | Status: DC

## 2016-08-03 MED ORDER — INSULIN GLARGINE 100 UNIT/ML SOLOSTAR PEN: 40.0000 [IU] | PEN_INJECTOR | Freq: Every day | SUBCUTANEOUS | 11 refills | Status: DC

## 2016-08-03 MED ORDER — POLYETHYLENE GLYCOL 3350 17 GM/SCOOP PO POWD: 17.0000 g | Freq: Every day | ORAL | 1 refills | Status: DC | PRN

## 2016-08-03 MED ORDER — GABAPENTIN 300 MG PO CAPS: 300.0000 mg | ORAL_CAPSULE | Freq: Every day | ORAL | 0 refills | Status: DC

## 2016-08-03 MED ORDER — ACYCLOVIR 400 MG PO TABS: 400.0000 mg | ORAL_TABLET | Freq: Three times a day (TID) | ORAL | 5 refills | Status: DC

## 2016-08-03 NOTE — Telephone Encounter (Signed)
Pt script have been printed and will be faxed over.

## 2016-08-03 NOTE — Telephone Encounter (Signed)
Printed script for patient 90 day supply.

## 2016-08-09 ENCOUNTER — Telehealth: Payer: Self-pay | Admitting: Family Medicine

## 2016-08-09 NOTE — Telephone Encounter (Signed)
Patient called the office to speak with PCP regarding her medication. Pt wanted know if she should take acyclovir (ZOVIRAX) 400 MG tablet, since she feels like she no longer has the infection. Pt also wants to know why she needs to take the polyethylene glycol powder. Last, pt was informed in her last visit that she had high bp and wasn't given any medication for it. Please follow up. Any new medication request can be sent to [facility].  Thank you.

## 2016-08-10 NOTE — Telephone Encounter (Signed)
1. She should take acyclovir as needed for 5 days if she has an outbreak of rash. 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  2. miralax should be taken as needed for constipation.  3. Her BP was normal at her last two office visits. No treatment needed.

## 2016-08-10 NOTE — Telephone Encounter (Signed)
Will route to PCP 

## 2016-08-13 ENCOUNTER — Ambulatory Visit: Payer: Medicare Other | Admitting: Podiatry

## 2016-10-08 ENCOUNTER — Other Ambulatory Visit: Payer: Self-pay | Admitting: Pharmacist

## 2016-10-08 MED ORDER — BASAGLAR KWIKPEN 100 UNIT/ML ~~LOC~~ SOPN: 40.0000 [IU] | PEN_INJECTOR | Freq: Every day | SUBCUTANEOUS | 5 refills | Status: DC

## 2016-10-29 DIAGNOSIS — E1165 Type 2 diabetes mellitus with hyperglycemia: Secondary | ICD-10-CM | POA: Diagnosis not present

## 2016-10-29 DIAGNOSIS — E11319 Type 2 diabetes mellitus with unspecified diabetic retinopathy without macular edema: Secondary | ICD-10-CM | POA: Diagnosis not present

## 2016-10-29 DIAGNOSIS — N183 Chronic kidney disease, stage 3 (moderate): Secondary | ICD-10-CM | POA: Diagnosis not present

## 2016-10-29 DIAGNOSIS — R109 Unspecified abdominal pain: Secondary | ICD-10-CM | POA: Diagnosis not present

## 2016-10-29 DIAGNOSIS — Z794 Long term (current) use of insulin: Secondary | ICD-10-CM | POA: Diagnosis not present

## 2016-10-29 DIAGNOSIS — E1142 Type 2 diabetes mellitus with diabetic polyneuropathy: Secondary | ICD-10-CM | POA: Diagnosis not present

## 2016-11-05 ENCOUNTER — Other Ambulatory Visit: Payer: Self-pay | Admitting: Pharmacist

## 2016-11-05 MED ORDER — BASAGLAR KWIKPEN 100 UNIT/ML ~~LOC~~ SOPN: 40.0000 [IU] | PEN_INJECTOR | Freq: Every day | SUBCUTANEOUS | 5 refills | Status: DC

## 2016-12-14 DIAGNOSIS — E1165 Type 2 diabetes mellitus with hyperglycemia: Secondary | ICD-10-CM | POA: Diagnosis not present

## 2016-12-14 DIAGNOSIS — E11319 Type 2 diabetes mellitus with unspecified diabetic retinopathy without macular edema: Secondary | ICD-10-CM | POA: Diagnosis not present

## 2016-12-14 DIAGNOSIS — N183 Chronic kidney disease, stage 3 (moderate): Secondary | ICD-10-CM | POA: Diagnosis not present

## 2016-12-14 DIAGNOSIS — E1142 Type 2 diabetes mellitus with diabetic polyneuropathy: Secondary | ICD-10-CM | POA: Diagnosis not present

## 2016-12-14 DIAGNOSIS — Z794 Long term (current) use of insulin: Secondary | ICD-10-CM | POA: Diagnosis not present

## 2017-01-09 ENCOUNTER — Encounter: Payer: Self-pay | Admitting: Family Medicine

## 2017-02-17 ENCOUNTER — Other Ambulatory Visit: Payer: Self-pay | Admitting: Family Medicine

## 2017-02-17 DIAGNOSIS — IMO0002 Reserved for concepts with insufficient information to code with codable children: Secondary | ICD-10-CM

## 2017-02-17 DIAGNOSIS — E1165 Type 2 diabetes mellitus with hyperglycemia: Secondary | ICD-10-CM

## 2017-02-17 DIAGNOSIS — E118 Type 2 diabetes mellitus with unspecified complications: Principal | ICD-10-CM

## 2017-02-17 DIAGNOSIS — Z794 Long term (current) use of insulin: Principal | ICD-10-CM

## 2017-03-12 DIAGNOSIS — Z794 Long term (current) use of insulin: Secondary | ICD-10-CM | POA: Diagnosis not present

## 2017-03-12 DIAGNOSIS — E1165 Type 2 diabetes mellitus with hyperglycemia: Secondary | ICD-10-CM | POA: Diagnosis not present

## 2017-03-12 DIAGNOSIS — E11319 Type 2 diabetes mellitus with unspecified diabetic retinopathy without macular edema: Secondary | ICD-10-CM | POA: Diagnosis not present

## 2017-03-12 DIAGNOSIS — E1142 Type 2 diabetes mellitus with diabetic polyneuropathy: Secondary | ICD-10-CM | POA: Diagnosis not present

## 2017-03-12 DIAGNOSIS — R8290 Unspecified abnormal findings in urine: Secondary | ICD-10-CM | POA: Diagnosis not present

## 2017-03-12 DIAGNOSIS — R899 Unspecified abnormal finding in specimens from other organs, systems and tissues: Secondary | ICD-10-CM | POA: Diagnosis not present

## 2017-03-12 DIAGNOSIS — R5383 Other fatigue: Secondary | ICD-10-CM | POA: Diagnosis not present

## 2017-03-12 DIAGNOSIS — L608 Other nail disorders: Secondary | ICD-10-CM | POA: Diagnosis not present

## 2017-03-12 DIAGNOSIS — N183 Chronic kidney disease, stage 3 (moderate): Secondary | ICD-10-CM | POA: Diagnosis not present

## 2017-03-21 DIAGNOSIS — Z961 Presence of intraocular lens: Secondary | ICD-10-CM | POA: Diagnosis not present

## 2017-03-21 DIAGNOSIS — H4720 Unspecified optic atrophy: Secondary | ICD-10-CM | POA: Diagnosis not present

## 2017-03-21 DIAGNOSIS — Z794 Long term (current) use of insulin: Secondary | ICD-10-CM | POA: Diagnosis not present

## 2017-03-21 DIAGNOSIS — E113521 Type 2 diabetes mellitus with proliferative diabetic retinopathy with traction retinal detachment involving the macula, right eye: Secondary | ICD-10-CM | POA: Diagnosis not present

## 2017-03-21 DIAGNOSIS — E113532 Type 2 diabetes mellitus with proliferative diabetic retinopathy with traction retinal detachment not involving the macula, left eye: Secondary | ICD-10-CM | POA: Diagnosis not present

## 2017-07-08 ENCOUNTER — Encounter (HOSPITAL_COMMUNITY): Payer: Self-pay

## 2017-07-17 ENCOUNTER — Ambulatory Visit: Payer: Medicare Other | Attending: Nurse Practitioner | Admitting: Nurse Practitioner

## 2017-07-17 ENCOUNTER — Encounter: Payer: Self-pay | Admitting: Nurse Practitioner

## 2017-07-17 VITALS — BP 119/67 | HR 80 | Temp 99.0°F | Resp 18 | Ht 61.0 in | Wt 152.6 lb

## 2017-07-17 DIAGNOSIS — Z13820 Encounter for screening for osteoporosis: Secondary | ICD-10-CM

## 2017-07-17 DIAGNOSIS — N183 Chronic kidney disease, stage 3 unspecified: Secondary | ICD-10-CM

## 2017-07-17 DIAGNOSIS — E559 Vitamin D deficiency, unspecified: Secondary | ICD-10-CM

## 2017-07-17 DIAGNOSIS — E1159 Type 2 diabetes mellitus with other circulatory complications: Secondary | ICD-10-CM

## 2017-07-17 DIAGNOSIS — E1122 Type 2 diabetes mellitus with diabetic chronic kidney disease: Secondary | ICD-10-CM | POA: Diagnosis not present

## 2017-07-17 DIAGNOSIS — E1169 Type 2 diabetes mellitus with other specified complication: Secondary | ICD-10-CM | POA: Diagnosis not present

## 2017-07-17 DIAGNOSIS — E785 Hyperlipidemia, unspecified: Secondary | ICD-10-CM | POA: Diagnosis present

## 2017-07-17 DIAGNOSIS — K589 Irritable bowel syndrome without diarrhea: Secondary | ICD-10-CM | POA: Diagnosis not present

## 2017-07-17 DIAGNOSIS — E1142 Type 2 diabetes mellitus with diabetic polyneuropathy: Secondary | ICD-10-CM | POA: Diagnosis not present

## 2017-07-17 DIAGNOSIS — Z79899 Other long term (current) drug therapy: Secondary | ICD-10-CM | POA: Diagnosis not present

## 2017-07-17 DIAGNOSIS — K5901 Slow transit constipation: Secondary | ICD-10-CM | POA: Diagnosis not present

## 2017-07-17 DIAGNOSIS — Z23 Encounter for immunization: Secondary | ICD-10-CM | POA: Diagnosis not present

## 2017-07-17 DIAGNOSIS — Z794 Long term (current) use of insulin: Secondary | ICD-10-CM | POA: Insufficient documentation

## 2017-07-17 LAB — GLUCOSE, POCT (MANUAL RESULT ENTRY): POC GLUCOSE: 150 mg/dL — AB (ref 70–99)

## 2017-07-17 LAB — POCT GLYCOSYLATED HEMOGLOBIN (HGB A1C): Hemoglobin A1C: 8.7

## 2017-07-17 MED ORDER — INSULIN ASPART 100 UNIT/ML FLEXPEN: 7.0000 [IU] | PEN_INJECTOR | Freq: Three times a day (TID) | SUBCUTANEOUS | 11 refills | Status: DC

## 2017-07-17 MED ORDER — GABAPENTIN 300 MG PO CAPS: 300.0000 mg | ORAL_CAPSULE | Freq: Every day | ORAL | 0 refills | Status: DC

## 2017-07-17 MED ORDER — BASAGLAR KWIKPEN 100 UNIT/ML ~~LOC~~ SOPN: 40.0000 [IU] | PEN_INJECTOR | Freq: Every day | SUBCUTANEOUS | 5 refills | Status: DC

## 2017-07-17 MED ORDER — ATORVASTATIN CALCIUM 40 MG PO TABS: 40.0000 mg | ORAL_TABLET | Freq: Every day | ORAL | 0 refills | Status: DC

## 2017-07-17 NOTE — Progress Notes (Signed)
Assessment & Plan:  Brenda Andrews was seen today for establish care.  Diagnoses and all orders for this visit:  Hyperlipidemia associated with type 2 diabetes mellitus ([place]) -     HgB A1c -     Glucose (CBG) -     CBC -     Lipid panel -     Microalbumin / creatinine urine ratio -     atorvastatin (LIPITOR) 40 MG tablet; Take 1 tablet (40 mg total) by mouth daily. Continue to work on low fat, heart healthy diet and participate in regular aerobic exercise program to control as well.  Diabetic polyneuropathy associated with type 2 diabetes mellitus (HCC) -     HgB A1c -     Glucose (CBG) -     gabapentin (NEURONTIN) 300 MG capsule; Take 1 capsule (300 mg total) by mouth at bedtime. -     Insulin Glargine (BASAGLAR KWIKPEN) 100 UNIT/ML SOPN; Inject 0.4 mLs (40 Units total) into the skin at bedtime. -     insulin aspart (NOVOLOG FLEXPEN) 100 UNIT/ML FlexPen; Inject 7 Units into the skin 3 (three) times daily with meals.  Slow transit constipation Patient reports resolved. She no longer takes miralax.   CKD (chronic kidney disease) stage 3, GFR 30-59 ml/min (HCC) -     CMP14+EGFR  Vitamin D deficiency -     VITAMIN D 25 Hydroxy (Vit-D Deficiency, Fractures)  Encounter for screening for osteoporosis -     DG Bone Density; Future  Other orders -     Pneumococcal polysaccharide vaccine 23-valent greater than or equal to 2yo subcutaneous/IM    Patient has been counseled on age-appropriate routine health concerns for screening and prevention. These are reviewed and up-to-date. Referrals have been placed accordingly. Immunizations are up-to-date or declined.    Subjective:   Chief Complaint  Patient presents with  . Establish Care    Patient here to establish care and need medicaion refills.   Brenda Andrews 71 y.o. female presents to office today to establish care with a new provider. At her request she has a close friend interpreting for her today. She was last seen in  this office on 2017. When I questioned why she had not been making her 3 month appointments she stated "I just didn't think about them".   Hyperlipidemia Patient presents for follow up to hyperlipidemia.  She is medication and diet compliant and denies chest pain, dyspnea, exertional chest pressure/discomfort, fatigue, palpitations and syncope or statin intolerance including myalgias.  Lab Results      Component                Value               Date                      CHOL                     182                 09/07/2014           Lab Results      Component                Value               Date  HDL                      34 (L)              09/07/2014           Lab Results      Component                Value               Date                      LDLCALC                  NOT CALC            09/07/2014           Lab Results      Component                Value               Date                      TRIG                     406 (H)             09/07/2014           Lab Results      Component                Value               Date                      CHOLHDL                  5.4                 09/07/2014             She  presents for  follow-up of Type 2 Diabetes Mellitus Disease course: no change. There are no hypoglycemic symptoms. There are no hypoglycemic complications. Symptoms are stable.  There are diabetic complications (POLYNEUROPATHY). Risk factors for coronary artery disease include family history, dyslipidemia, diabetes mellitus, obesity and sedentary lifestyle. Current diabetic treatment includes insulin (NOVOLOG AND LANTUS). Patient is compliant with treatment all of the time and monitors blood glucose at home "here and there".  Patient follows a generally healthy diet. Meal planning includes avoidance of concentrated sweets. Patient has not seen a dietician. Patient is not compliant with exercise.   Home blood glucose trend : (FBS 100s mg/dl)  An ACE  inhibitor/angiotensin II receptor blocker is not being taken. She does not see a podiatrist. Eye exam is current.    Neuropathy  She describes symptoms of numbness, lancinating pain and tingling. Onset of symptoms was gradual. Symptoms are currently of mild severity. Symptoms occur intermittently and last minutes to hours. The patient denies cramping, squeezing and hypersensitivity. Symptoms are worse on the left side. She denies  orthostasis, nausea, alternating diarrhea and constipation, dry eyes and dry mouth and blurred vision. Previous treatment has included Neurontin/Gabapentin, which has improved symptoms.    Review of Systems  Constitutional: Negative for fever, malaise/fatigue and weight loss.  HENT: Negative.  Negative for nosebleeds.   Eyes: Negative.  Negative for  blurred vision, double vision and photophobia.  Respiratory: Negative.  Negative for cough and shortness of breath.   Cardiovascular: Negative.  Negative for chest pain, palpitations and leg swelling.  Gastrointestinal: Negative.  Negative for abdominal pain, constipation, diarrhea, heartburn, nausea and vomiting.  Musculoskeletal: Positive for back pain. Negative for myalgias.  Neurological: Positive for tingling and sensory change. Negative for dizziness, focal weakness, seizures and headaches.  Endo/Heme/Allergies: Negative for environmental allergies.  Psychiatric/Behavioral: Negative.  Negative for suicidal ideas.    Past Medical History:  Diagnosis Date  . Chronic kidney disease 2002  . COLITIS 03/19/2008   Qualifier: Diagnosis of  By: Marland Mcalpine    . Diabetes mellitus 1982  . Hyperlipidemia 2013  . IBS (irritable bowel syndrome)     History reviewed. No pertinent surgical history.  Family History  Problem Relation Age of Onset  . Diabetes Brother   . Diabetes Brother   . Diabetes Brother   . Diabetes Brother     Social History Reviewed with no changes to be made today.   Outpatient  Medications Prior to Visit  Medication Sig Dispense Refill  . ACCU-CHEK AVIVA PLUS test strip USE AS DIRECTED TO TEST BLOOD SUGAR THREE TIMES DAILY 100 each 5  . ACCU-CHEK FASTCLIX LANCETS MISC USE 3 TIMES PER DAY BEFORE MEALS AND EVERY NIGHT AT BEDTIME 102 each 5  . B-D ULTRAFINE III SHORT PEN 31G X 8 MM MISC USE AS DIRECTED FOUR TIMES DAILY 200 each 0  . Blood Glucose Monitoring Suppl (ACCU-CHEK AVIVA PLUS) w/Device KIT Use as directed - E11.65 1 kit 0  . gabapentin (NEURONTIN) 300 MG capsule Take 1 capsule (300 mg total) by mouth at bedtime. 90 capsule 0  . insulin aspart (NOVOLOG FLEXPEN) 100 UNIT/ML FlexPen Inject 7 Units into the skin 3 (three) times daily with meals. 15 mL 11  . Insulin Glargine (BASAGLAR KWIKPEN) 100 UNIT/ML SOPN Inject 0.4 mLs (40 Units total) into the skin at bedtime. 15 mL 5  . acyclovir (ZOVIRAX) 400 MG tablet Take 1 tablet (400 mg total) by mouth 3 (three) times daily. 45 tablet 5  . atorvastatin (LIPITOR) 40 MG tablet TAKE 1 TABLET BY MOUTH DAILY (Patient not taking: Reported on 06/25/2016) 90 tablet 0  . polyethylene glycol powder (GLYCOLAX/MIRALAX) powder Take 17 g by mouth daily as needed for moderate constipation. (Patient not taking: Reported on 07/17/2017) 3350 g 1   No facility-administered medications prior to visit.     Allergies  Allergen Reactions  . Pollen Extract-Tree Extract   . Sulfonamide Derivatives        Objective:    BP 119/67 (BP Location: Right Arm, Patient Position: Sitting, Cuff Size: Normal)   Pulse 80   Temp 99 F (37.2 C) (Oral)   Resp 18   Ht 5' 1"  (1.549 m)   Wt 152 lb 9.6 oz (69.2 kg)   BMI 28.83 kg/m  Wt Readings from Last 3 Encounters:  07/17/17 152 lb 9.6 oz (69.2 kg)  06/25/16 146 lb (66.2 kg)  05/18/16 140 lb 6.4 oz (63.7 kg)    Physical Exam  Constitutional: She is oriented to person, place, and time. She appears well-developed and well-nourished. She is cooperative.  HENT:  Head: Normocephalic and  atraumatic.  Eyes: EOM are normal.  Neck: Normal range of motion.  Cardiovascular: Normal rate, regular rhythm, normal heart sounds and intact distal pulses. Exam reveals no gallop and no friction rub.  No murmur heard. Pulmonary/Chest: Effort normal and breath  sounds normal. No tachypnea. No respiratory distress. She has no decreased breath sounds. She has no wheezes. She has no rhonchi. She has no rales. She exhibits no tenderness.  Abdominal: Soft. Bowel sounds are normal.  Musculoskeletal: Normal range of motion. She exhibits no edema.  Neurological: She is alert and oriented to person, place, and time. Coordination normal.  Skin: Skin is warm, dry and intact.  Sensory exam of the foot is normal, tested with the monofilament. Good pulses, no lesions or ulcers, good peripheral pulses.  Psychiatric: She has a normal mood and affect. Her behavior is normal. Judgment and thought content normal.  Nursing note and vitals reviewed.        Patient has been counseled extensively about nutrition and exercise as well as the importance of adherence with medications and regular follow-up. The patient was given clear instructions to go to ER or return to medical center if symptoms don't improve, worsen or new problems develop. The patient verbalized understanding.   Follow-up: Return in about 3 months (around 10/17/2017) for Fasting labs and physical.   Gildardo Pounds, FNP-BC [facility] [place], [place]   07/17/2017, 10:37 PM

## 2017-07-17 NOTE — Patient Instructions (Addendum)
Control del nivel sanguneo de glucosa en los adultos (Blood Glucose Monitoring, Adult) El control del nivel de azcar (glucosa) en la sangre lo ayuda a tener la diabetes bajo control. Tambin ayuda a que usted y el mdico controlen si el tratamiento de la diabetes es Armed forces logistics/support/administrative officer. El control del nivel sanguneo de glucosa implica realizar controles regulares como lo indique el mdico y Catering manager registro de los resultados (registro diario). POR QU DEBO [place] DE GLUCOSA? Si controla su nivel sanguneo de glucosa con regularidad, podr:  Comprender de [employer], la actividad fsica, las enfermedades y los medicamentos inciden en los niveles sanguneos de [place].  Conocer el nivel sanguneo de glucosa en cualquier momento dado. Saber rpidamente si el nivel es bajo (hipoglucemia) o alto (hiperglucemia).  Puede ser de ayuda para que usted y el mdico sepan cmo ajustar los medicamentos. [place] DE GLUCOSA? Siga las indicaciones del mdico acerca de la frecuencia con la que debe controlar el nivel sanguneo de glucosa. La frecuencia puede depender de:  El tipo de diabetes que tenga.  Si su diabetes est bajo control.  Los medicamentos que toma. Si usted tiene diabetes tipo 1:  Controle su nivel sanguneo de glucosa al [employer] al da.  Tambin controle su nivel sanguneo de glucosa: ? Antes de cada inyeccin de insulina. ? Antes y despus de hacer ejercicio. ? [place] comidas. ? Dos horas despus de una comida. ? Ocasionalmente, entre las 2:00a.m. y las 3:00a.m., como se lo hayan indicado. ? Antes de Optometrist tareas peligrosas, English as a second language teacher o usar maquinaria pesada. ? A la hora de acostarse.  Es posible que Geophysicist/field seismologist con ms frecuencia los niveles sanguneos de [place], [place] 6 a 10veces por da: ? Si [place] una bomba de [place]. ? Si necesita varias inyecciones diarias. ? Si su diabetes no est bien  controlada. ? Si est enfermo. ? Si tiene antecedentes de hipoglucemia grave. ? Si tiene antecedentes de no darse cuenta cundo est bajando su nivel sanguneo de glucosa (hipoglucemia asintomtica). Si usted tiene diabetes tipo 2:  Si recibe insulina u otro medicamento para la diabetes, controle el nivel sanguneo de glucosa al [employer] veces al da.  Mientras reciba tratamiento intensivo con insulina, debe medirse el nivel sanguneo de glucosa al menos 4veces al [employer]. Ocasionalmente, es posible que deba controlarse entre las 2:00a.m. y las 3:00a.m., segn se lo indiquen.  Tambin controle su nivel sanguneo de glucosa: ? Antes y despus de hacer ejercicio. ? Antes de Optometrist tareas peligrosas, English as a second language teacher o usar maquinaria pesada.  Es posible que Geophysicist/field seismologist con ms frecuencia los niveles sanguneos de glucosa si: ? Es necesario ajustar la dosis de sus medicamentos. ? Su diabetes no est bien controlada. ? Est enfermo. QU ES UN REGISTRO [place] DE GLUCOSA?  Un registro diario es un registro de los valores de glucosa en la [place]. Puede ayudarles a usted y a su mdico a: ? Health visitor en su nivel sanguneo de glucosa durante el transcurso del [place]. ? Ajustar su plan de control de la diabetes como sea necesario.  Cada vez que controle su nivel sanguneo de glucosa, anote el resultado y aquellos factores que pueden estar afectando su nivel sanguneo de glucosa, como la dieta y la actividad fsica Designer, multimedia.  La [employer] de los medidores de glucosa guardan un registro de las lecturas realizadas con el medidor. Algunos permiten descargar sus registros en  una computadora. ° °¿CÓMO ME CONTROLO EL NIVEL SANGUÍNEO DE GLUCOSA? °Siga los siguientes pasos para obtener lecturas precisas de su glucemia: °Materiales necesarios °· Medidor de glucosa en la sangre. °· Tiras reactivas para el medidor. Cada medidor tiene sus propias tiras reactivas. Debe usar  las tiras reactivas que trae su medidor. °· Una aguja para pincharse el dedo (lanceta). No utilice la misma lanceta en más de una ocasión. °· Un dispositivo que sujeta la lanceta (dispositivo de punción). °· Un diario o libro de anotaciones para anotar los resultados. °Procedimiento °· Lávese las manos con agua y jabón. °· Pínchese el costado del dedo (no la punta) con la lanceta. Use un dedo diferente cada vez. °· Frote suavemente el dedo hasta que aparezca una pequeña gota de sangre. °· Siga las instrucciones que vienen con el medidor para insertar la tira reactiva, aplicar la sangre sobre la tira y usar el medidor de glucosa en la sangre. °· Registre el resultado y las observaciones que desee. °Zonas del cuerpo alternativas para realizar las pruebas °· Algunos medidores le permiten tomar sangre para la prueba de otras zonas del cuerpo que no son el dedo (zonas alternativas). °· Si cree que tiene hipoglucemia o si tiene hipoglucemia asintomática, no utilice las zonas alternativas del cuerpo. En su lugar, use los dedos. °· Es posible que las zonas alternativas no sean tan precisas como los dedos porque el flujo de sangre es más lento en esas zonas. Esto significa que el resultado que obtiene de estas zonas puede estar retrasado y ser un poco diferente del resultado que obtendría del dedo. °· Los sitios alternativos más comunes son los siguientes: °? Los antebrazos. °? Los muslos. °? La palma de la mano. °Consejos adicionales °· Siempre tenga los insumos a mano. °· Todos los medidores de glucosa incluyen un número de teléfono "directo", disponible las 24 horas, al que podrá llamar si tiene preguntas o necesita ayuda. También puede consultar a su médico. °· Después de usar algunas cajas de tiras reactivas, ajuste (calibre) el medidor de glucemia según las instrucciones del medidor. °Esta información no tiene como fin reemplazar el consejo del médico. Asegúrese de hacerle al médico cualquier pregunta que  tenga. °Document Released: 08/13/2005 Document Revised: 12/05/2015 Document Reviewed: 01/23/2016 °Elsevier Interactive Patient Education © 2017 Elsevier Inc. ° °

## 2017-07-18 LAB — CMP14+EGFR
A/G RATIO: 1.3 (ref 1.2–2.2)
ALT: 25 IU/L (ref 0–32)
AST: 26 IU/L (ref 0–40)
Albumin: 4.3 g/dL (ref 3.5–4.8)
Alkaline Phosphatase: 133 IU/L — ABNORMAL HIGH (ref 39–117)
BILIRUBIN TOTAL: 0.3 mg/dL (ref 0.0–1.2)
BUN/Creatinine Ratio: 17 (ref 12–28)
BUN: 28 mg/dL — ABNORMAL HIGH (ref 8–27)
CHLORIDE: 103 mmol/L (ref 96–106)
CO2: 26 mmol/L (ref 20–29)
Calcium: 9.9 mg/dL (ref 8.7–10.3)
Creatinine, Ser: 1.61 mg/dL — ABNORMAL HIGH (ref 0.57–1.00)
GFR, EST AFRICAN AMERICAN: 37 mL/min/{1.73_m2} — AB (ref >59)
GFR, EST NON AFRICAN AMERICAN: 32 mL/min/{1.73_m2} — AB (ref >59)
GLOBULIN, TOTAL: 3.3 g/dL (ref 1.5–4.5)
Glucose: 98 mg/dL (ref 65–99)
POTASSIUM: 5.1 mmol/L (ref 3.5–5.2)
SODIUM: 141 mmol/L (ref 134–144)
TOTAL PROTEIN: 7.6 g/dL (ref 6.0–8.5)

## 2017-07-18 LAB — LIPID PANEL
CHOLESTEROL TOTAL: 133 mg/dL (ref 100–199)
Chol/HDL Ratio: 3 ratio (ref 0.0–4.4)
HDL: 45 mg/dL (ref >39)
LDL CALC: 63 mg/dL (ref 0–99)
TRIGLYCERIDES: 127 mg/dL (ref 0–149)
VLDL Cholesterol Cal: 25 mg/dL (ref 5–40)

## 2017-07-18 LAB — CBC
HEMOGLOBIN: 12 g/dL (ref 11.1–15.9)
Hematocrit: 35.3 % (ref 34.0–46.6)
MCH: 30.9 pg (ref 26.6–33.0)
MCHC: 34 g/dL (ref 31.5–35.7)
MCV: 91 fL (ref 79–97)
PLATELETS: 208 10*3/uL (ref 150–379)
RBC: 3.88 x10E6/uL (ref 3.77–5.28)
RDW: 14.3 % (ref 12.3–15.4)
WBC: 7.7 10*3/uL (ref 3.4–10.8)

## 2017-07-18 LAB — MICROALBUMIN / CREATININE URINE RATIO
CREATININE, UR: 31.4 mg/dL
Microalb/Creat Ratio: 44.6 mg/g creat — ABNORMAL HIGH (ref 0.0–30.0)
Microalbumin, Urine: 14 ug/mL

## 2017-07-18 LAB — VITAMIN D 25 HYDROXY (VIT D DEFICIENCY, FRACTURES): VIT D 25 HYDROXY: 28.1 ng/mL — AB (ref 30.0–100.0)

## 2017-07-22 ENCOUNTER — Other Ambulatory Visit: Payer: Self-pay | Admitting: Nurse Practitioner

## 2017-07-22 ENCOUNTER — Ambulatory Visit
Admission: RE | Admit: 2017-07-22 | Discharge: 2017-07-22 | Disposition: A | Discharge status: Home / Self Care | Payer: Medicare Other | Source: Ambulatory Visit | Attending: Nurse Practitioner | Admitting: Nurse Practitioner

## 2017-07-22 DIAGNOSIS — M85852 Other specified disorders of bone density and structure, left thigh: Secondary | ICD-10-CM | POA: Diagnosis not present

## 2017-07-22 DIAGNOSIS — M810 Age-related osteoporosis without current pathological fracture: Secondary | ICD-10-CM

## 2017-07-22 DIAGNOSIS — Z780 Asymptomatic menopausal state: Secondary | ICD-10-CM | POA: Diagnosis not present

## 2017-07-22 DIAGNOSIS — Z13820 Encounter for screening for osteoporosis: Secondary | ICD-10-CM

## 2017-07-23 ENCOUNTER — Telehealth: Payer: Self-pay

## 2017-07-23 NOTE — Telephone Encounter (Signed)
-----   Message from Gildardo Pounds, NP sent at 07/22/2017  8:20 PM EST ----- Bone density testing shows osteopenia. I would like for you to take at least 800 units of Vitamin D3 and 1200mg  of Calcium daily.

## 2017-07-23 NOTE — Telephone Encounter (Signed)
Interpreter # Cory Roughen [phone]  Patient informed lab result. Patient stated that she already have a appointment with the Nephrology. Patient is aware a referral is made with the Nephrology.   Patient verified DOB.

## 2017-07-23 NOTE — Telephone Encounter (Signed)
Interpreter # Cory Roughen [phone]  Patient informed to take OTC calcium and VitaminD3 daily.  Patient verified DOB.

## 2017-07-23 NOTE — Telephone Encounter (Signed)
-----   Message from Gildardo Pounds, NP sent at 07/22/2017  8:41 PM EST ----- Kidney function is declining. I have placed a referral to nephrology for further evaluation and treatment. Please make sure you stay hydrated and drink plenty of water.

## 2017-08-07 DIAGNOSIS — I129 Hypertensive chronic kidney disease with stage 1 through stage 4 chronic kidney disease, or unspecified chronic kidney disease: Secondary | ICD-10-CM | POA: Diagnosis not present

## 2017-08-07 DIAGNOSIS — N189 Chronic kidney disease, unspecified: Secondary | ICD-10-CM | POA: Diagnosis not present

## 2017-08-07 DIAGNOSIS — N183 Chronic kidney disease, stage 3 (moderate): Secondary | ICD-10-CM | POA: Diagnosis not present

## 2017-08-07 DIAGNOSIS — E1122 Type 2 diabetes mellitus with diabetic chronic kidney disease: Secondary | ICD-10-CM | POA: Diagnosis not present

## 2017-08-07 DIAGNOSIS — D631 Anemia in chronic kidney disease: Secondary | ICD-10-CM | POA: Diagnosis not present

## 2017-08-07 DIAGNOSIS — E1121 Type 2 diabetes mellitus with diabetic nephropathy: Secondary | ICD-10-CM | POA: Diagnosis not present

## 2017-08-07 DIAGNOSIS — N2581 Secondary hyperparathyroidism of renal origin: Secondary | ICD-10-CM | POA: Diagnosis not present

## 2017-08-15 DIAGNOSIS — E11319 Type 2 diabetes mellitus with unspecified diabetic retinopathy without macular edema: Secondary | ICD-10-CM | POA: Diagnosis not present

## 2017-08-15 DIAGNOSIS — Z794 Long term (current) use of insulin: Secondary | ICD-10-CM | POA: Diagnosis not present

## 2017-08-15 DIAGNOSIS — M79673 Pain in unspecified foot: Secondary | ICD-10-CM | POA: Diagnosis not present

## 2017-08-15 DIAGNOSIS — E1165 Type 2 diabetes mellitus with hyperglycemia: Secondary | ICD-10-CM | POA: Diagnosis not present

## 2017-08-15 DIAGNOSIS — N183 Chronic kidney disease, stage 3 (moderate): Secondary | ICD-10-CM | POA: Diagnosis not present

## 2017-08-15 DIAGNOSIS — E1142 Type 2 diabetes mellitus with diabetic polyneuropathy: Secondary | ICD-10-CM | POA: Diagnosis not present

## 2017-08-30 ENCOUNTER — Ambulatory Visit (INDEPENDENT_AMBULATORY_CARE_PROVIDER_SITE_OTHER): Payer: Medicare Other | Admitting: Podiatry

## 2017-08-30 ENCOUNTER — Encounter: Payer: Self-pay | Admitting: Podiatry

## 2017-08-30 DIAGNOSIS — B351 Tinea unguium: Secondary | ICD-10-CM

## 2017-08-30 DIAGNOSIS — L600 Ingrowing nail: Secondary | ICD-10-CM

## 2017-08-30 DIAGNOSIS — M79674 Pain in right toe(s): Secondary | ICD-10-CM | POA: Diagnosis not present

## 2017-08-30 DIAGNOSIS — M79675 Pain in left toe(s): Secondary | ICD-10-CM

## 2017-09-02 NOTE — Progress Notes (Signed)
Subjective: Brenda Andrews presents the office today with a friend who translates for her for concerns of pain to all of her toenails.  Most notably both of her big toenails are starting to get painful on the corners.  She is appears to have a partial nail avulsions.  She does she denies any redness or drainage or any swelling.  No recent injury or trauma.  She has no other concerns today. Denies any systemic complaints such as fevers, chills, nausea, vomiting. No acute changes since last appointment, and no other complaints at this time.   Objective: AAO x3, NAD DP/PT pulses palpable bilaterally, CRT less than 3 seconds Nails are hypertrophic, dystrophic, brittle, discolored, elongated 10.  Along the bilateral hallux toenails along the nail corners on the right side there is a small spicule of nail on the proximal medial aspect.  On the other aspects of the hallux toenails there is no significant incurvation however there is some callus, dry skin formation which was debrided today.  There is no signs of infection.  No surrounding redness or drainage. Tenderness nails 1-5 bilaterally. No open lesions or pre-ulcerative lesions are identified today. No open lesions or pre-ulcerative lesions.  No pain with calf compression, swelling, warmth, erythema  Assessment: Symptomatic onychomycosis  Plan: -All treatment options discussed with the patient including all alternatives, risks, complications.  -Nails are sharply debrided x10 without any complications or bleeding. -She was inquired about total nail avulsion.  At this time we will see how she does with just debridement but if we need to we can always consider total nail avulsion -Discussed treatment options for thickening of the nails. -RTC 3 months or sooner if needed.  -Patient encouraged to call the office with any questions, concerns, change in symptoms.   Trula Slade DPM

## 2017-10-16 ENCOUNTER — Ambulatory Visit: Payer: Medicare Other | Attending: Nurse Practitioner | Admitting: Nurse Practitioner

## 2017-10-16 ENCOUNTER — Encounter: Payer: Medicare Other | Admitting: Nurse Practitioner

## 2017-10-16 ENCOUNTER — Encounter: Payer: Self-pay | Admitting: Nurse Practitioner

## 2017-10-16 VITALS — BP 112/69 | HR 87 | Temp 98.3°F | Ht 61.0 in | Wt 153.2 lb

## 2017-10-16 DIAGNOSIS — N183 Chronic kidney disease, stage 3 unspecified: Secondary | ICD-10-CM

## 2017-10-16 DIAGNOSIS — K589 Irritable bowel syndrome without diarrhea: Secondary | ICD-10-CM | POA: Diagnosis not present

## 2017-10-16 DIAGNOSIS — M25552 Pain in left hip: Secondary | ICD-10-CM

## 2017-10-16 DIAGNOSIS — Z79899 Other long term (current) drug therapy: Secondary | ICD-10-CM | POA: Diagnosis not present

## 2017-10-16 DIAGNOSIS — E1122 Type 2 diabetes mellitus with diabetic chronic kidney disease: Secondary | ICD-10-CM | POA: Insufficient documentation

## 2017-10-16 DIAGNOSIS — E1142 Type 2 diabetes mellitus with diabetic polyneuropathy: Secondary | ICD-10-CM | POA: Diagnosis not present

## 2017-10-16 DIAGNOSIS — Z0000 Encounter for general adult medical examination without abnormal findings: Secondary | ICD-10-CM | POA: Insufficient documentation

## 2017-10-16 DIAGNOSIS — E1165 Type 2 diabetes mellitus with hyperglycemia: Secondary | ICD-10-CM | POA: Insufficient documentation

## 2017-10-16 DIAGNOSIS — Z794 Long term (current) use of insulin: Secondary | ICD-10-CM | POA: Insufficient documentation

## 2017-10-16 DIAGNOSIS — E785 Hyperlipidemia, unspecified: Secondary | ICD-10-CM | POA: Insufficient documentation

## 2017-10-16 LAB — GLUCOSE, POCT (MANUAL RESULT ENTRY)
POC Glucose: 50 mg/dl — AB (ref 70–99)
POC Glucose: 116 mg/dl — AB (ref 70–99)

## 2017-10-16 MED ORDER — BASAGLAR KWIKPEN 100 UNIT/ML ~~LOC~~ SOPN: 40.0000 [IU] | PEN_INJECTOR | Freq: Every day | SUBCUTANEOUS | 12 refills | Status: DC

## 2017-10-16 MED ORDER — GABAPENTIN 300 MG PO CAPS: 300.0000 mg | ORAL_CAPSULE | Freq: Every day | ORAL | 0 refills | Status: AC

## 2017-10-16 MED ORDER — ATORVASTATIN CALCIUM 40 MG PO TABS: 40.0000 mg | ORAL_TABLET | Freq: Every day | ORAL | 0 refills | Status: DC

## 2017-10-16 MED ORDER — DICLOFENAC SODIUM 1 % TD GEL
2.0000 g | Freq: Four times a day (QID) | TRANSDERMAL | 1 refills | Status: AC
Start: 2017-10-16 — End: ?

## 2017-10-16 MED ORDER — INSULIN ASPART 100 UNIT/ML FLEXPEN: 7.0000 [IU] | PEN_INJECTOR | Freq: Three times a day (TID) | SUBCUTANEOUS | 11 refills | Status: DC

## 2017-10-16 NOTE — Progress Notes (Signed)
p 

## 2017-10-16 NOTE — Progress Notes (Addendum)
Assessment & Plan:  Brenda Andrews was seen today for annual exam, medication refill and leg pain.  Diagnoses and all orders for this visit:  Uncontrolled type 2 diabetes mellitus with hyperglycemia (HCC) -     Glucose (CBG) -     Glucose (CBG) -     Insulin Glargine (BASAGLAR KWIKPEN) 100 UNIT/ML SOPN; Inject 0.4 mLs (40 Units total) into the skin daily at 10 pm. (Patient taking differently: Inject 60 Units into the skin daily at 10 pm. ) -     insulin aspart (NOVOLOG FLEXPEN) 100 UNIT/ML FlexPen; Inject 7 Units into the skin 3 (three) times daily with meals. (Patient taking differently: Inject 20 Units into the skin 3 (three) times daily with meals. Inject 20-28 units into the skin 3 (three) times a day with meals) -     atorvastatin (LIPITOR) 40 MG tablet; Take 1 tablet (40 mg total) by mouth daily at 6 PM. -     Hemoglobin A1c; Future   Work on a low fat, heart healthy diet and participate in regular aerobic exercise program to control as well by working out at least 150 minutes per week. No fried foods. No junk foods, sodas, sugary drinks, unhealthy snacking, or smoking.   CKD (chronic kidney disease) stage 3, GFR 30-59 ml/min (HCC) -     Ambulatory referral to Nephrology Lab Results  Component Value Date   CREATININE 1.61 (H) 07/17/2017    Diabetic polyneuropathy associated with type 2 diabetes mellitus (HCC) -     gabapentin (NEURONTIN) 300 MG capsule; Take 1 capsule (300 mg total) by mouth at bedtime. Take medication as prescribed.  Left hip pain -     diclofenac sodium (VOLTAREN) 1 % GEL; Apply 2 g topically 4 (four) times daily. May also use XS tylenol for pain relief.  Heat application to left hip for pain relief.  May apply ice to left hip for swelling.     Patient has been counseled on age-appropriate routine health concerns for screening and prevention. These are reviewed and up-to-date. Referrals have been placed accordingly. Immunizations are up-to-date or declined.     Subjective:   Chief Complaint  Patient presents with  . Annual Exam    Patient is here for a physical.  . Medication Refill    Patient need medication refills and would like 90 days supply.  . Leg Pain    Patient stated she have a pain on her sciatic nerve on her left leg for a month.     HPI Brenda Andrews 72 y.o. female presents to office today. She accompanied by her niece today who is translating for her.    Diabetes Mellitus TYPE 2 During the review of patient's medications, there seems to be much confusion regarding the patient's prescribed dosages versus what she is currently taking.  Brenda Andrews tells me that she has been taking 22-28 units of NovoLog 3 times a day (SSI) and 60 units of basaglar every evening. She is seeing Dr. Buddy Duty with [facility]. However I do not have any records from Dr. Buddy Duty since 03-17-2017.  Her blood sugar today is 50. She required treatment today her in the office to increase her blood sugar which increased to 116 prior to the end of her office visit. She was instructed to contact Dr. [name] office for glucose readings below 70. I am unable to average her blood sugars as her glucometer that she has with her today does only has a few readings and the  readings are not  consecutive days. She was instructed to check her blood sugars TID according to Dr. [name] notes. It does not appear that she has been logging her blood sugars TID.  Lab Results  Component Value Date   HGBA1C 7.9 (H) 10/17/2017    Hyperlipidemia LDL is at goal.  She endorses medication compliance with Lipitor 40 mg every evening.  She denies a statin intolerance or myalgias.  She does attempt to exercise routinely by riding her stationary bike. Lab Results  Component Value Date   LDLCALC 63 07/17/2017   Hip Pain Patient complains of left hip pain. Onset of the symptoms was several months ago. Inciting event: noneCurrent symptoms include is aggravated by walking and is  better with activity. Associated symptoms: none. Aggravating symptoms: going up and down stairs and rising after sitting. Patient's overall course: waxing and waning. Patient has had prior hip problems. Previous visits for this problem: none. Evaluation to date: none.  Treatment to date: prescription analgesics, which have been effective.   Review of Systems  Constitutional: Negative for fever, malaise/fatigue and weight loss.  HENT: Negative.  Negative for nosebleeds.   Eyes: Negative.  Negative for blurred vision, double vision and photophobia.  Respiratory: Negative.  Negative for cough and shortness of breath.   Cardiovascular: Negative.  Negative for chest pain, palpitations and leg swelling.  Gastrointestinal: Negative.  Negative for abdominal pain, constipation, diarrhea, heartburn, nausea and vomiting.  Musculoskeletal: Positive for joint pain and myalgias.       SEE HPI  Neurological: Negative.  Negative for dizziness, focal weakness, seizures and headaches.  Endo/Heme/Allergies: Negative for environmental allergies.  Psychiatric/Behavioral: Negative.  Negative for suicidal ideas.    Past Medical History:  Diagnosis Date  . Chronic kidney disease 2002  . COLITIS 03/19/2008   Qualifier: Diagnosis of  By: Marland Mcalpine    . Diabetes mellitus 1982  . Hyperlipidemia 2013  . IBS (irritable bowel syndrome)     History reviewed. No pertinent surgical history.  Family History  Problem Relation Age of Onset  . Diabetes Brother   . Diabetes Brother   . Diabetes Brother   . Diabetes Brother     Social History Reviewed with no changes to be made today.   Outpatient Medications Prior to Visit  Medication Sig Dispense Refill  . ACCU-CHEK AVIVA PLUS test strip USE AS DIRECTED TO TEST BLOOD SUGAR THREE TIMES DAILY 100 each 5  . ACCU-CHEK FASTCLIX LANCETS MISC USE 3 TIMES PER DAY BEFORE MEALS AND EVERY NIGHT AT BEDTIME 102 each 5  . B-D ULTRAFINE III SHORT PEN 31G X 8 MM MISC  USE AS DIRECTED FOUR TIMES DAILY 200 each 0  . Blood Glucose Monitoring Suppl (ACCU-CHEK AVIVA PLUS) w/Device KIT Use as directed - E11.65 1 kit 0  . Multiple Vitamins-Minerals (CENTRUM SILVER 50+WOMEN PO) Take by mouth.    Marland Kitchen atorvastatin (LIPITOR) 10 MG tablet TK 1 T PO HS  3  . atorvastatin (LIPITOR) 40 MG tablet TK 1 T PO QD.  0  . insulin aspart (NOVOLOG FLEXPEN) 100 UNIT/ML FlexPen Inject 7 Units into the skin 3 (three) times daily with meals. 15 mL 11  . Insulin Glargine (BASAGLAR KWIKPEN) 100 UNIT/ML SOPN Inject 0.4 mLs (40 Units total) into the skin at bedtime. 15 mL 5  . acyclovir (ZOVIRAX) 400 MG tablet Take 1 tablet (400 mg total) by mouth 3 (three) times daily. (Patient not taking: Reported on 10/16/2017) 45 tablet 5  . gabapentin (  NEURONTIN) 300 MG capsule Take 1 capsule (300 mg total) by mouth at bedtime. 90 capsule 0  . insulin lispro (HUMALOG) 100 UNIT/ML KiwkPen Inject into the skin.     No facility-administered medications prior to visit.     Allergies  Allergen Reactions  . Pollen Extract-Tree Extract   . Sulfonamide Derivatives        Objective:    BP 112/69 (BP Location: Right Arm, Patient Position: Sitting, Cuff Size: Normal)   Pulse 87   Temp 98.3 F (36.8 C) (Oral)   Ht 5' 1"  (1.549 m)   Wt 153 lb 3.2 oz (69.5 kg)   SpO2 97%   BMI 28.95 kg/m  Wt Readings from Last 3 Encounters:  10/16/17 153 lb 3.2 oz (69.5 kg)  07/17/17 152 lb 9.6 oz (69.2 kg)  06/25/16 146 lb (66.2 kg)    Physical Exam  Constitutional: She is oriented to person, place, and time. She appears well-developed and well-nourished. She is cooperative.  HENT:  Head: Normocephalic and atraumatic.  Eyes: EOM are normal.  Neck: Normal range of motion.  Cardiovascular: Normal rate, regular rhythm, normal heart sounds and intact distal pulses. Exam reveals no gallop and no friction rub.  No murmur heard. Pulmonary/Chest: Effort normal and breath sounds normal. No tachypnea. No respiratory  distress. She has no decreased breath sounds. She has no wheezes. She has no rhonchi. She has no rales. She exhibits no tenderness.  Abdominal: Soft. Bowel sounds are normal.  Musculoskeletal: Normal range of motion. She exhibits no edema.       Left hip: She exhibits no tenderness, no bony tenderness, no swelling and no deformity.  Pain elicited with passive flexion, abduction and adduction of left hip   Neurological: She is alert and oriented to person, place, and time. Coordination normal.  Skin: Skin is warm and dry.  Psychiatric: She has a normal mood and affect. Her behavior is normal. Judgment and thought content normal.  Nursing note and vitals reviewed.     Patient has been counseled extensively about nutrition and exercise as well as the importance of adherence with medications and regular follow-up. The patient was given clear instructions to go to ER or return to medical center if symptoms don't improve, worsen or new problems develop. The patient verbalized understanding.   Follow-up: Return in about 3 months (around 01/13/2018) for HPL/DM.   Gildardo Pounds, FNP-BC [facility] [place], [place]   10/19/2017, 12:02 AM

## 2017-10-16 NOTE — Patient Instructions (Addendum)
Diabetes mellitus y nutrición  Diabetes Mellitus and Nutrition  Si sufre de diabetes (diabetes mellitus), es muy importante tener hábitos alimenticios saludables debido a que sus niveles de azúcar en la sangre (glucosa) se ven afectados en gran medida por lo que come y bebe. Comer alimentos saludables en las cantidades adecuadas, aproximadamente a la misma hora todos los días, lo ayudará a:  · Controlar la glucemia.  · Disminuir el riesgo de sufrir una enfermedad cardíaca.  · Mejorar la presión arterial.  · Alcanzar o mantener un peso saludable.    Todas las personas que sufren de diabetes son diferentes y cada una tiene necesidades diferentes en cuanto a un plan de alimentación. El médico puede recomendarle que trabaje con un especialista en dietas y nutrición (nutricionista) para elaborar el mejor plan para usted. Su plan de alimentación puede variar según factores como:  · Las calorías que necesita.  · Los medicamentos que toma.  · Su peso.  · Sus niveles de glucemia, presión arterial y colesterol.  · Su nivel de actividad.  · Otras afecciones que tenga, como enfermedades cardíacas o renales.    ¿Cómo me afectan los carbohidratos?  Los carbohidratos afectan el nivel de glucemia más que cualquier otro tipo de alimento. La ingesta de carbohidratos naturalmente aumenta la cantidad glucosa en la sangre. El recuento de carbohidratos es un método destinado a llevar un registro de la cantidad de carbohidratos que se ingieren. El recuento de carbohidratos es importante para mantener la glucemia a un nivel saludable, en especial si utiliza insulina o toma determinados medicamentos por vía oral para la diabetes.  Es importante saber la cantidad de carbohidratos que se pueden ingerir en cada comida sin correr ningún riesgo. Esto es diferente en cada persona. El nutricionista puede ayudarlo a calcular la cantidad de carbohidratos que debe ingerir en cada comida y colación.   Los alimentos que contienen carbohidratos incluyen:  · Pan, cereal, arroz, pasta y galletas.  · Papas y maíz.  · Guisantes, frijoles y lentejas.  · Leche y yogur.  · Frutas y jugo.  · Postres, como pasteles, galletitas, helado y caramelos.    ¿Cómo me afecta el alcohol?  El alcohol puede provocar disminuciones súbitas de la glucemia (hipoglucemia), en especial si utiliza insulina o toma determinados medicamentos por vía oral para la diabetes. La hipoglucemia es una afección potencialmente mortal. Los síntomas de la hipoglucemia (somnolencia, mareos y confusión) son similares a los síntomas de haber consumido demasiado alcohol.  Si el médico afirma que el alcohol es seguro para usted, siga estas pautas:  · Limite el consumo de alcohol a no más de 1 medida por día si es mujer y no está embarazada, y a 2 medidas si es hombre. Una medida equivale a 12 oz (355 ml) de cerveza, 5 oz (148 ml) de vino o 1½ oz (44 ml) de bebidas de alta graduación alcohólica.  · No beba con el estómago vacío.  · Manténgase hidratado con agua, gaseosas dietéticas o té helado sin azúcar.  · Tenga en cuenta que las gaseosas comunes, los jugos y otros refrescos pueden contener mucha azúcar y se deben contar como carbohidratos.    Consejos para seguir este plan  Leer las etiquetas de los alimentos  · Comience por controlar el tamaño de la porción en la etiqueta. La cantidad de calorías, carbohidratos, grasas y otros nutrientes mencionados en la etiqueta se basan en una porción del alimento. Muchos alimentos contienen más de una porción por envase.  · Verifique la cantidad total de gramos (g)   de carbohidratos totales en una porción. Puede calcular la cantidad de porciones de carbohidratos al dividir el total de carbohidratos por 15. Por ejemplo, si un alimento posee un total de 30 g de carbohidratos, equivale a 2 porciones de carbohidratos.  · Verifique la cantidad de gramos (g) de grasas saturadas y grasas trans  en una porción. Escoja alimentos que no contengan grasa o que tengan un bajo contenido.  · Controle la cantidad de miligramos (mg) de sodio en una porción. La mayoría de las personas deben limitar la ingesta de sodio total a menos de 2300 mg por día.  · Siempre consulte la información nutricional de los alimentos etiquetados como “con bajo contenido de grasa” o “sin grasa”. Estos alimentos pueden ser más altos en azúcar agregada o en carbohidratos refinados y deben evitarse.  · Hable con el nutricionista para identificar sus objetivos diarios en cuanto a los nutrientes mencionados en la etiqueta.  De compras  · Evite comprar alimentos procesados, enlatados o prehechos. Estos alimentos tienden a tener mayor cantidad de grasa, sodio y azúcar agregada.  · Compre en la zona exterior de la tienda de comestibles. Esta incluye frutas y vegetales frescos, granos a granel, carnes frescas y productos lácteos frescos.  Cocción  · Utilice métodos de cocción a baja temperatura, como hornear, en lugar de métodos de cocción a alta temperatura, como freír en abundante aceite.  · Cocine con aceites saludables, como el aceite de oliva, canola o girasol.  · Evite cocinar con manteca, crema o carnes con alto contenido de grasa.  Planificación de las comidas  · Consuma las comidas y las colaciones de forma regular, preferentemente a la misma hora todos los días. Evite pasar largos períodos de tiempo sin comer.  · Consuma alimentos ricos en fibra, como frutas frescas, verduras, frijoles y cereales integrales. Consulte al nutricionista sobre cuántas porciones de carbohidratos puede consumir en cada comida.  · Consuma entre 4 y 6 onzas de proteínas magras por día, como carnes magras, pollo, pescado, huevos o tofu. 1 onza equivale a 1 onza de carne, pollo o pescado, 1 huevo, o 1/4 taza de tofu.  · Coma algunos alimentos por día que contengan grasas saludables, como aguacates, frutos secos, semillas y pescado.  Estilo de vida     · Controle su nivel de glucemia con regularidad.  · Haga ejercicio al menos 30 minutos, 5 días o más por semana, o como se lo haya indicado el médico.  · Tome los medicamentos como se lo haya indicado el médico.  · No consuma ningún producto que contenga nicotina o tabaco, como cigarrillos y cigarrillos electrónicos. Si necesita ayuda para dejar de fumar, consulte al médico.  · Trabaje con un asesor o instructor en diabetes para identificar estrategias para controlar el estrés y cualquier desafío emocional y social.  ¿Cuáles son algunas de las preguntas que puedo hacerle a mi médico?  · ¿Es necesario que me reúna con un instructor en diabetes?  · ¿Es necesario que me reúna con un nutricionista?  · ¿A qué número puedo llamar si tengo preguntas?  · ¿Cuáles son los mejores momentos para controlar la glucemia?  Dónde encontrar más información:  · Asociación Americana de la Diabetes (American Diabetes Association): diabetes.org/food-and-fitness/food  · Academia de Nutrición y Dietética (Academy of Nutrition and Dietetics): www.eatright.org/resources/health/diseases-and-conditions/diabetes  · Instituto Nacional de la Diabetes y las Enfermedades Digestivas y Renales (National Institute of Diabetes and Digestive and Kidney Diseases) (Institutos Nacionales de Salud, NIH): www.niddk.nih.gov/health-information/diabetes/overview/diet-eating-physical-activity  Resumen  · Un plan de alimentación saludable   lo ayudará a controlar la glucemia y mantener un estilo de vida saludable.  · Trabajar con un especialista en dietas y nutrición (nutricionista) puede ayudarlo a elaborar el mejor plan de alimentación para usted.  · Tenga en cuenta que los carbohidratos y el alcohol tienen efectos inmediatos en sus niveles de glucemia. Es importante contar los carbohidratos y consumir alcohol con prudencia.  Esta información no tiene como fin reemplazar el consejo del médico. Asegúrese de hacerle al médico cualquier pregunta que tenga.   Document Released: 11/20/2007 Document Revised: 12/03/2016 Document Reviewed: 12/03/2016  Elsevier Interactive Patient Education © 2018 [employer]

## 2017-10-17 ENCOUNTER — Ambulatory Visit: Payer: Medicare Other | Attending: Nurse Practitioner

## 2017-10-17 DIAGNOSIS — E1165 Type 2 diabetes mellitus with hyperglycemia: Secondary | ICD-10-CM | POA: Diagnosis not present

## 2017-10-17 NOTE — Progress Notes (Signed)
Patient here for lab visit only 

## 2017-10-18 LAB — HEMOGLOBIN A1C
ESTIMATED AVERAGE GLUCOSE: 180 mg/dL
HEMOGLOBIN A1C: 7.9 % — AB (ref 4.8–5.6)

## 2017-10-19 ENCOUNTER — Encounter: Payer: Self-pay | Admitting: Nurse Practitioner

## 2017-10-23 IMAGING — MG 2D DIGITAL SCREENING BILATERAL MAMMOGRAM WITH CAD AND ADJUNCT TO
9 of 12 series · 9 of 28 positions shown · non-contrast
Comparison: Previous exam(s).

CLINICAL DATA: Screening.

EXAM:
2D DIGITAL SCREENING BILATERAL MAMMOGRAM WITH CAD AND ADJUNCT TOMO

[R CC synth-2D]
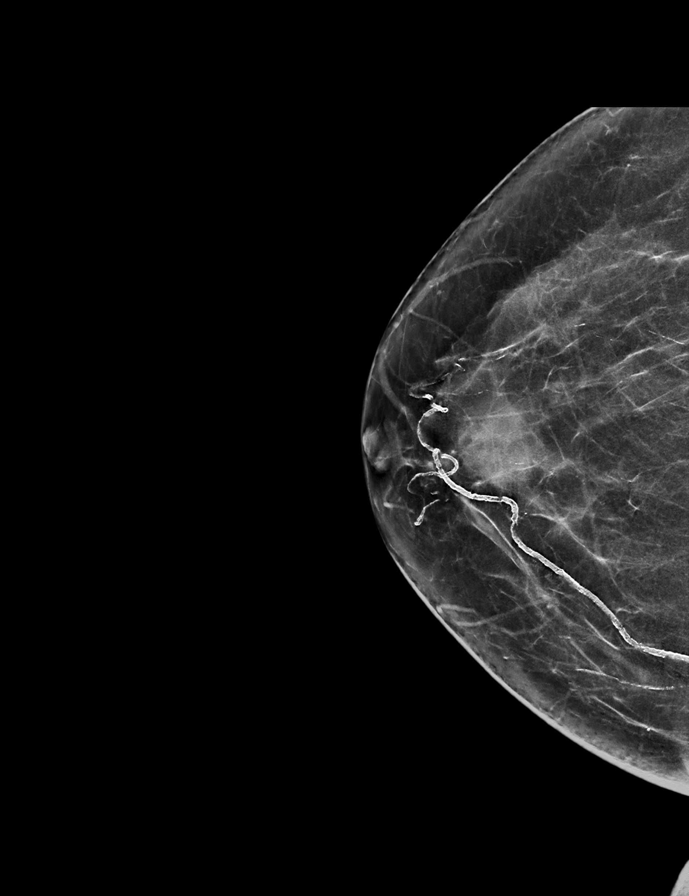

[L MLO]
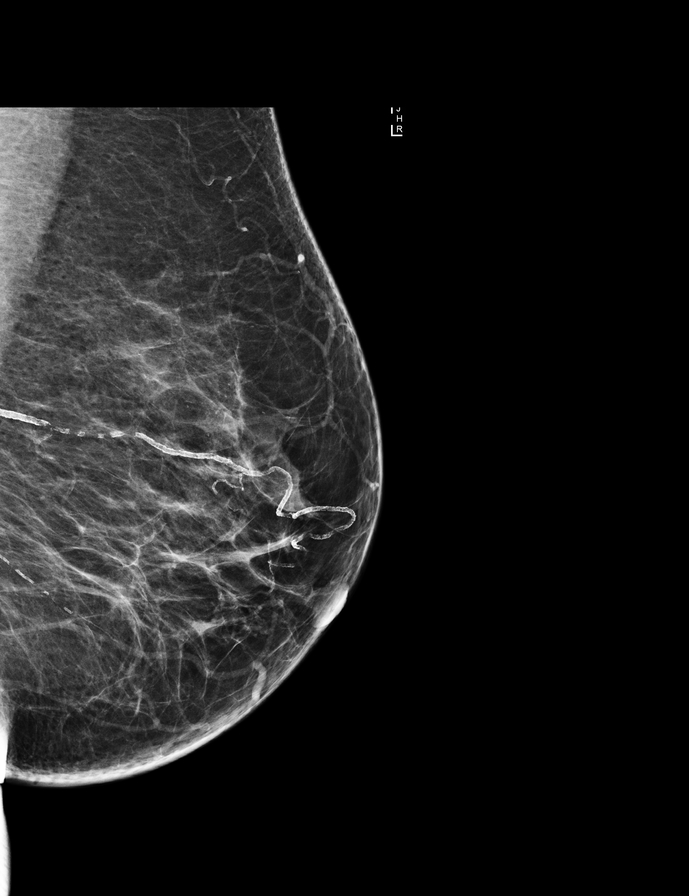

[L CC synth-2D]
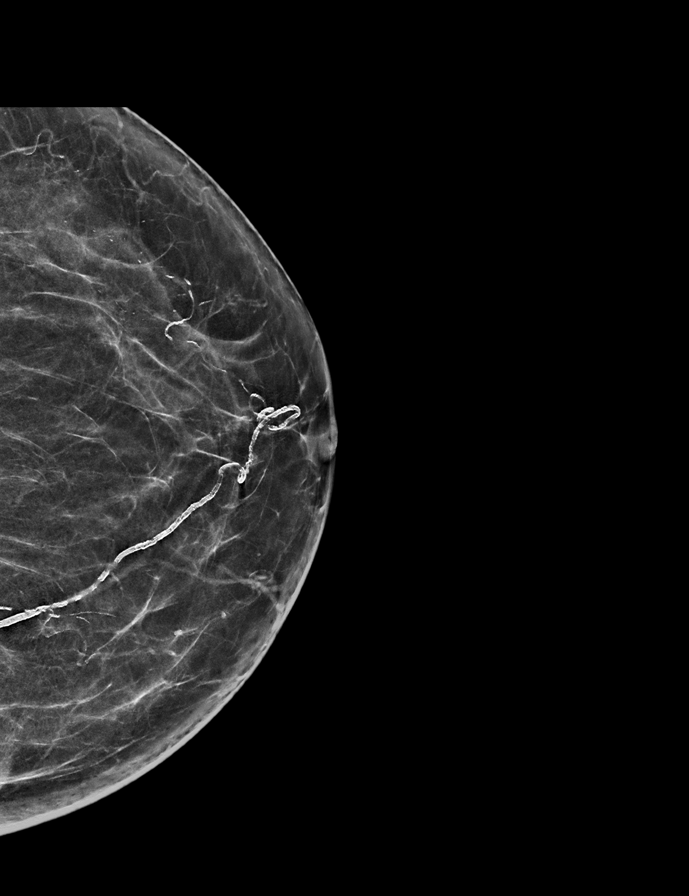

[R CC]
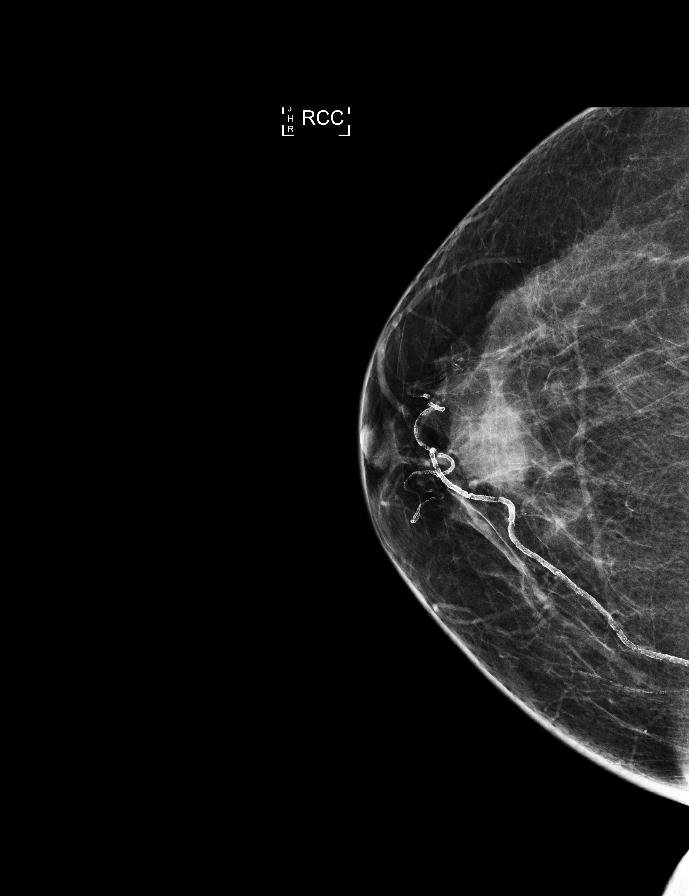

[R MLO]
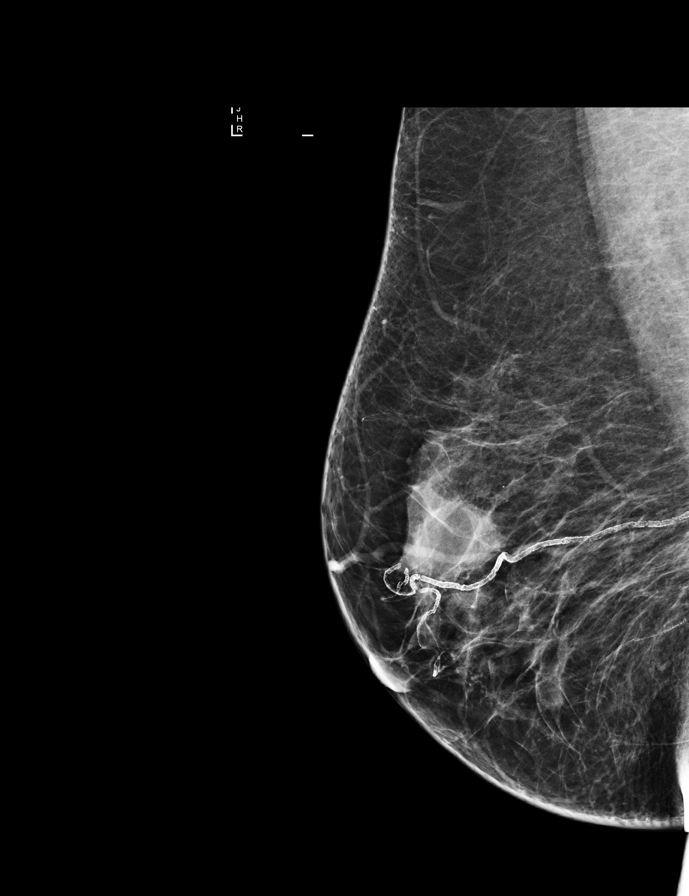

[L MLO synth-2D]
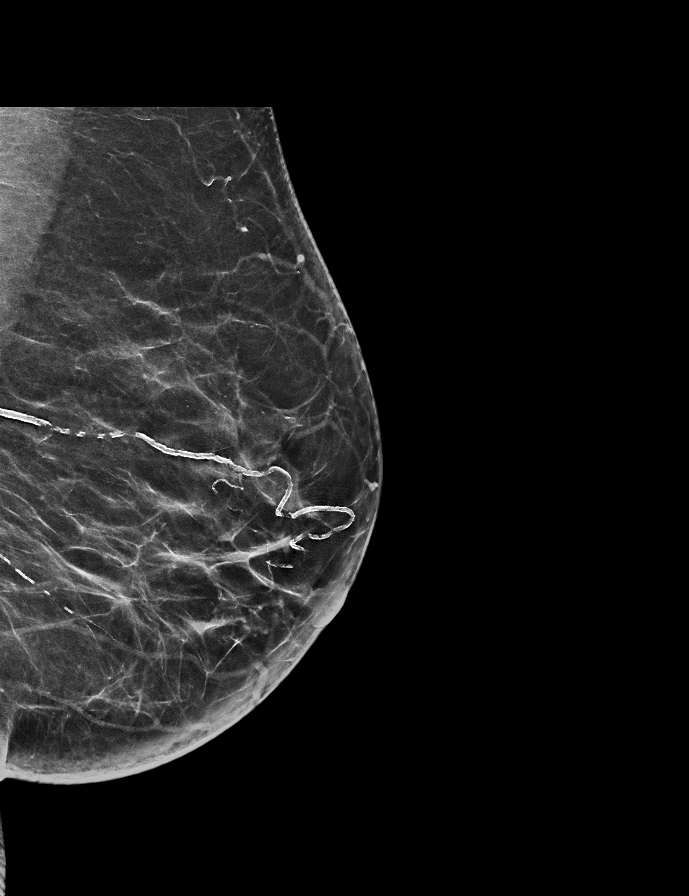

[L CC]
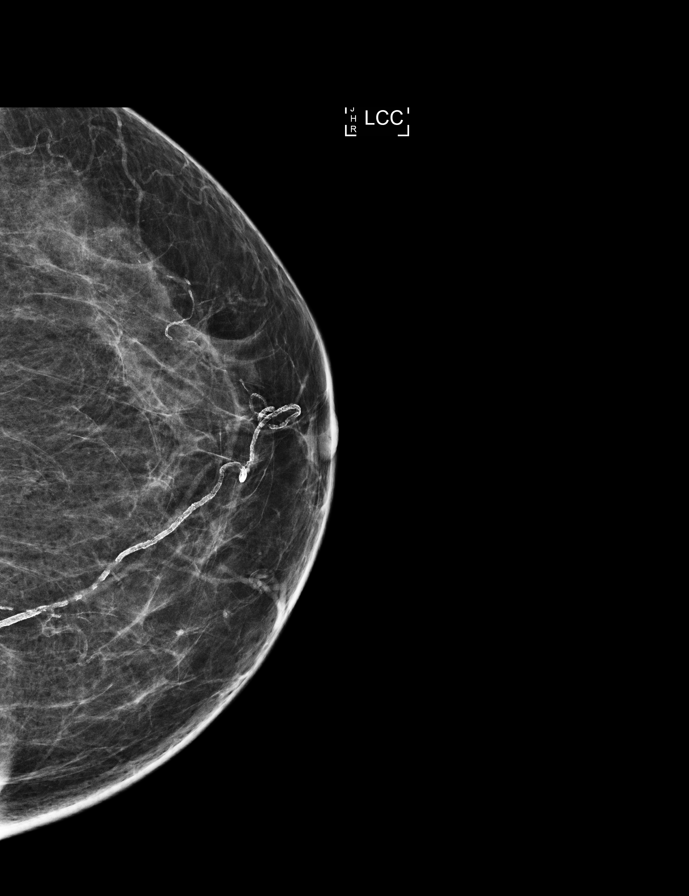

[R MLO synth-2D]
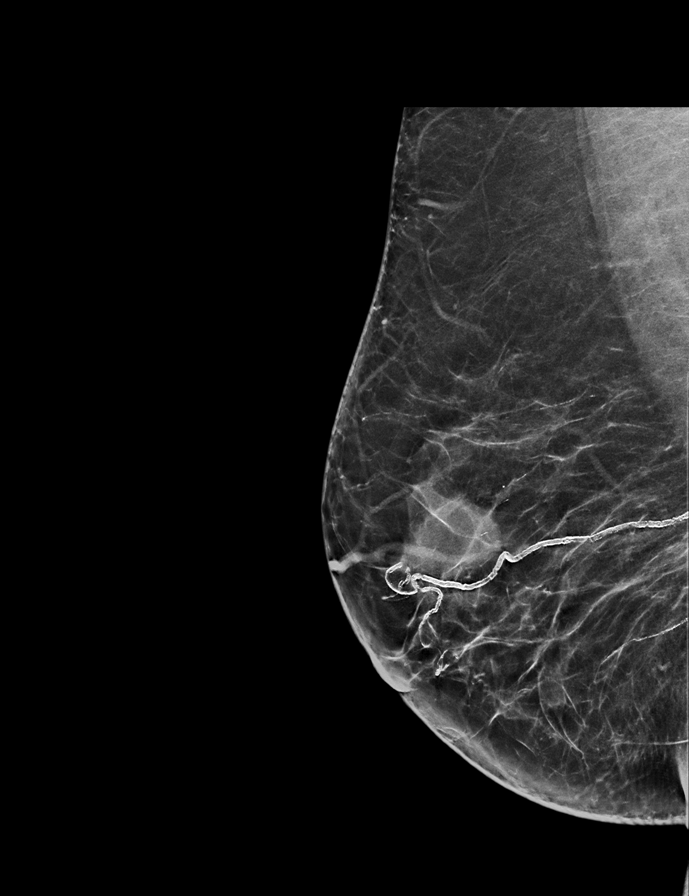

[R CC tomo · tomo slice 31/62.0]
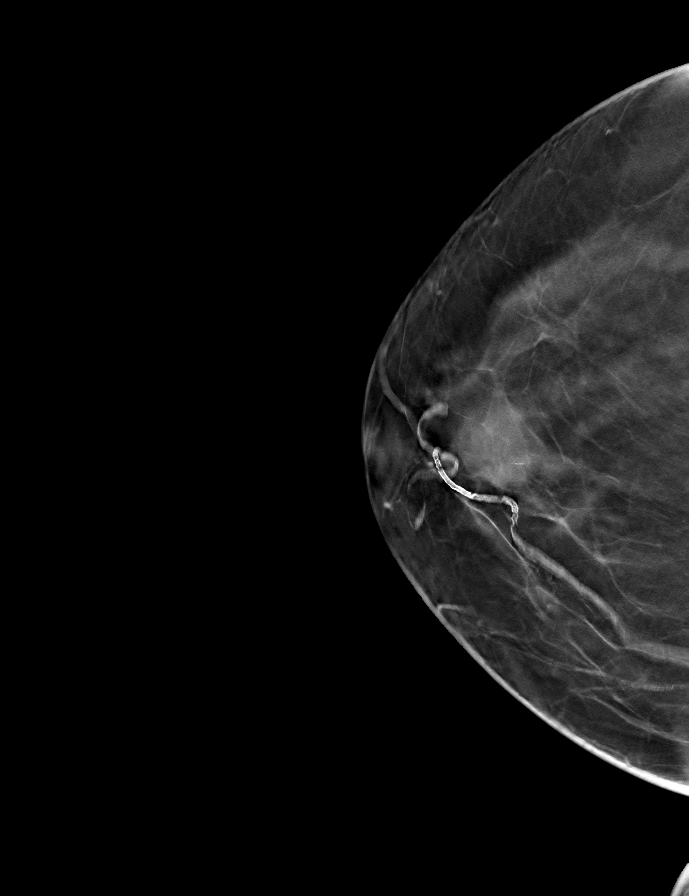

[9 of 28 positions shown; findings below may reference images not displayed]

ACR Breast Density Category c: The breast tissue is heterogeneously
dense, which may obscure small masses.
FINDINGS: There are no findings suspicious for malignancy. Images were
processed with CAD.
IMPRESSION: No mammographic evidence of malignancy. A result letter of this
screening mammogram will be mailed directly to the patient.

RECOMMENDATION:
Screening mammogram in one year. (Code:[id number])

BI-RADS CATEGORY  1: Negative.

## 2017-11-29 ENCOUNTER — Ambulatory Visit: Payer: Medicare Other | Admitting: Podiatry

## 2017-12-04 DIAGNOSIS — E1142 Type 2 diabetes mellitus with diabetic polyneuropathy: Secondary | ICD-10-CM | POA: Diagnosis not present

## 2017-12-04 DIAGNOSIS — N183 Chronic kidney disease, stage 3 (moderate): Secondary | ICD-10-CM | POA: Diagnosis not present

## 2017-12-04 DIAGNOSIS — D528 Other folate deficiency anemias: Secondary | ICD-10-CM | POA: Diagnosis not present

## 2017-12-04 DIAGNOSIS — Z794 Long term (current) use of insulin: Secondary | ICD-10-CM | POA: Diagnosis not present

## 2017-12-04 DIAGNOSIS — E1165 Type 2 diabetes mellitus with hyperglycemia: Secondary | ICD-10-CM | POA: Diagnosis not present

## 2017-12-04 DIAGNOSIS — E11319 Type 2 diabetes mellitus with unspecified diabetic retinopathy without macular edema: Secondary | ICD-10-CM | POA: Diagnosis not present

## 2017-12-04 DIAGNOSIS — R5383 Other fatigue: Secondary | ICD-10-CM | POA: Diagnosis not present

## 2017-12-18 ENCOUNTER — Encounter: Payer: Self-pay | Admitting: Nurse Practitioner

## 2017-12-18 ENCOUNTER — Ambulatory Visit: Payer: Medicare Other | Attending: Nurse Practitioner | Admitting: Nurse Practitioner

## 2017-12-18 VITALS — BP 110/66 | HR 76 | Temp 98.7°F | Ht 61.0 in | Wt 154.0 lb

## 2017-12-18 DIAGNOSIS — E1165 Type 2 diabetes mellitus with hyperglycemia: Secondary | ICD-10-CM | POA: Insufficient documentation

## 2017-12-18 DIAGNOSIS — Z794 Long term (current) use of insulin: Secondary | ICD-10-CM | POA: Insufficient documentation

## 2017-12-18 DIAGNOSIS — Z79899 Other long term (current) drug therapy: Secondary | ICD-10-CM | POA: Diagnosis not present

## 2017-12-18 DIAGNOSIS — E1122 Type 2 diabetes mellitus with diabetic chronic kidney disease: Secondary | ICD-10-CM | POA: Insufficient documentation

## 2017-12-18 DIAGNOSIS — N184 Chronic kidney disease, stage 4 (severe): Secondary | ICD-10-CM | POA: Diagnosis not present

## 2017-12-18 LAB — GLUCOSE, POCT (MANUAL RESULT ENTRY): POC Glucose: 158 mg/dl — AB (ref 70–99)

## 2017-12-18 MED ORDER — BASAGLAR KWIKPEN 100 UNIT/ML ~~LOC~~ SOPN: 40.0000 [IU] | PEN_INJECTOR | Freq: Every day | SUBCUTANEOUS | 12 refills | Status: DC

## 2017-12-18 MED ORDER — INSULIN ASPART 100 UNIT/ML FLEXPEN: 10.0000 [IU] | PEN_INJECTOR | Freq: Three times a day (TID) | SUBCUTANEOUS | 11 refills | Status: AC

## 2017-12-18 MED ORDER — INSULIN PEN NEEDLE 31G X 8 MM MISC: 2 refills | Status: AC

## 2017-12-18 MED FILL — NOVOLOG FLEXPEN SYRINGE: 100 | 30 days supply | Qty: 9 | Fill #0

## 2017-12-18 MED FILL — !BASAGLAR 100 UNIT/ML KWIK: 100 | 30 days supply | Qty: 12 | Fill #0

## 2017-12-18 MED FILL — TRUEplus 5-BEVEL PEN NEEDLE: 31G X 8 MM | 25 days supply | Qty: 100 | Fill #0

## 2017-12-18 NOTE — Progress Notes (Signed)
Assessment & Plan:  Brenda Andrews was seen today for follow-up and medication refill.  Diagnoses and all orders for this visit:  Stage 4 chronic kidney disease ([place]) -     CMP14+EGFR DASH DIET  Uncontrolled type 2 diabetes mellitus with hyperglycemia ([facility]) -     Glucose (CBG) -     insulin aspart (NOVOLOG FLEXPEN) 100 UNIT/ML FlexPen; Inject 10 Units into the skin 3 (three) times daily with meals. -     Insulin Glargine (BASAGLAR KWIKPEN) 100 UNIT/ML SOPN; Inject 0.4 mLs (40 Units total) into the skin daily at 10 pm. -     Insulin Pen Needle (B-D ULTRAFINE III SHORT PEN) 31G X 8 MM MISC; USE AS DIRECTED FOUR TIMES DAILY Continue blood sugar control as discussed in office today, low carbohydrate diet, and regular physical exercise as tolerated, 150 minutes per week (30 min each day, 5 days per week, or 50 min 3 days per week). Keep blood sugar logs with fasting goal of 80-130 mg/dl, post prandial less than 180.  For Hypoglycemia: BS <60 and Hyperglycemia BS >400; contact the clinic ASAP. Annual eye exams and foot exams are recommended.   Patient has been counseled on age-appropriate routine health concerns for screening and prevention. These are reviewed and up-to-date. Referrals have been placed accordingly. Immunizations are up-to-date or declined.    Subjective:   Chief Complaint  Patient presents with  . Follow-up    Pt. is here for a follow-up on diabetes.   . Medication Refill   HPI Brenda Andrews 72 y.o. female presents to office today for follow up. She has a history of CKD stage 4 and DM Type 2. She is accompanied by her daughter who is interpreting for her today.    DM Type 2 Chronic. She is still seeing Dr. Buddy Duty (Endo)  for her Diabetes.  Checking blood sugars twice a week and reports readings of 120-130s. She denies any symptoms of hypo or hyperglycemia.  Lab Results  Component Value Date   HGBA1C 7.9 (H) 10/17/2017    CKD stage 4 She has been seen by  [facility] since her last office visit. She states she saw Dr. Justin Mend and was told her labs were normal and was instructed to follow up in 6 months. I will request records from this office. She denies any GU symptoms including hematuria or flank pain.    Review of Systems  Constitutional: Negative for fever, malaise/fatigue and weight loss.  HENT: Negative.  Negative for nosebleeds.   Eyes: Negative.  Negative for blurred vision, double vision and photophobia.  Respiratory: Negative.  Negative for cough and shortness of breath.   Cardiovascular: Negative.  Negative for chest pain, palpitations and leg swelling.  Gastrointestinal: Negative.  Negative for heartburn, nausea and vomiting.  Musculoskeletal: Negative.  Negative for myalgias.  Neurological: Negative.  Negative for dizziness, focal weakness, seizures and headaches.  Psychiatric/Behavioral: Negative.  Negative for suicidal ideas.    Past Medical History:  Diagnosis Date  . Chronic kidney disease 2002  . COLITIS 03/19/2008   Qualifier: Diagnosis of  By: Marland Mcalpine    . Diabetes mellitus 1982  . Hyperlipidemia 2013  . IBS (irritable bowel syndrome)     History reviewed. No pertinent surgical history.  Family History  Problem Relation Age of Onset  . Diabetes Brother   . Diabetes Brother   . Diabetes Brother   . Diabetes Brother     Social History Reviewed with no changes to  be made today.   Outpatient Medications Prior to Visit  Medication Sig Dispense Refill  . ACCU-CHEK AVIVA PLUS test strip USE AS DIRECTED TO TEST BLOOD SUGAR THREE TIMES DAILY 100 each 5  . ACCU-CHEK FASTCLIX LANCETS MISC USE 3 TIMES PER DAY BEFORE MEALS AND EVERY NIGHT AT BEDTIME 102 each 5  . atorvastatin (LIPITOR) 40 MG tablet Take 1 tablet (40 mg total) by mouth daily at 6 PM. 90 tablet 0  . Blood Glucose Monitoring Suppl (ACCU-CHEK AVIVA PLUS) w/Device KIT Use as directed - E11.65 1 kit 0  . cholecalciferol (VITAMIN D)  1000 units tablet Take 1,000 Units by mouth 2 (two) times daily.    . Multiple Vitamins-Minerals (CENTRUM SILVER 50+WOMEN PO) Take by mouth.    . B-D ULTRAFINE III SHORT PEN 31G X 8 MM MISC USE AS DIRECTED FOUR TIMES DAILY 200 each 0  . insulin aspart (NOVOLOG FLEXPEN) 100 UNIT/ML FlexPen Inject 7 Units into the skin 3 (three) times daily with meals. (Patient taking differently: Inject 20 Units into the skin 3 (three) times daily with meals. Inject 20-28 units into the skin 3 (three) times a day with meals) 15 mL 11  . Insulin Glargine (BASAGLAR KWIKPEN) 100 UNIT/ML SOPN Inject 0.4 mLs (40 Units total) into the skin daily at 10 pm. (Patient taking differently: Inject 60 Units into the skin daily at 10 pm. ) 3 mL 12  . acyclovir (ZOVIRAX) 400 MG tablet Take 1 tablet (400 mg total) by mouth 3 (three) times daily. (Patient not taking: Reported on 10/16/2017) 45 tablet 5  . diclofenac sodium (VOLTAREN) 1 % GEL Apply 2 g topically 4 (four) times daily. (Patient not taking: Reported on 12/18/2017) 100 g 1  . gabapentin (NEURONTIN) 300 MG capsule Take 1 capsule (300 mg total) by mouth at bedtime. 90 capsule 0   No facility-administered medications prior to visit.     Allergies  Allergen Reactions  . Pollen Extract-Tree Extract   . Sulfonamide Derivatives        Objective:    BP 110/66 (BP Location: Right Arm, Patient Position: Sitting, Cuff Size: Normal)   Pulse 76   Temp 98.7 F (37.1 C) (Oral)   Ht 5' 1"  (1.549 m)   Wt 154 lb (69.9 kg)   BMI 29.10 kg/m  Wt Readings from Last 3 Encounters:  12/18/17 154 lb (69.9 kg)  10/16/17 153 lb 3.2 oz (69.5 kg)  07/17/17 152 lb 9.6 oz (69.2 kg)    Physical Exam  Constitutional: She is oriented to person, place, and time. She appears well-developed and well-nourished. She is cooperative.  HENT:  Head: Normocephalic and atraumatic.  Eyes: EOM are normal.  Neck: Normal range of motion.  Cardiovascular: Normal rate, regular rhythm, normal heart  sounds and intact distal pulses. Exam reveals no gallop and no friction rub.  No murmur heard. Pulmonary/Chest: Effort normal and breath sounds normal. No tachypnea. No respiratory distress. She has no decreased breath sounds. She has no wheezes. She has no rhonchi. She has no rales. She exhibits no tenderness.  Abdominal: Soft. Bowel sounds are normal.  Musculoskeletal: Normal range of motion. She exhibits no edema.  Neurological: She is alert and oriented to person, place, and time. Coordination normal.  Skin: Skin is warm and dry.  Psychiatric: She has a normal mood and affect. Her behavior is normal. Judgment and thought content normal.  Nursing note and vitals reviewed.      Patient has been counseled extensively about nutrition and  exercise as well as the importance of adherence with medications and regular follow-up. The patient was given clear instructions to go to ER or return to medical center if symptoms don't improve, worsen or new problems develop. The patient verbalized understanding.   Follow-up: Return in about 6 months (around 06/19/2018) for Please give her ROI forms .   Gildardo Pounds, FNP-BC [facility] and [facility], [place]   12/18/2017, 1:00 PM

## 2017-12-18 NOTE — Patient Instructions (Signed)
Health Maintenance for Postmenopausal Women Menopause is a normal process in which your reproductive ability comes to an end. This process happens gradually over a span of months to years, usually between the ages of 22 and 9. Menopause is complete when you have missed 12 consecutive menstrual periods. It is important to talk with your health care provider about some of the most common conditions that affect postmenopausal women, such as heart disease, cancer, and bone loss (osteoporosis). Adopting a healthy lifestyle and getting preventive care can help to promote your health and wellness. Those actions can also lower your chances of developing some of these common conditions. What should I know about menopause? During menopause, you may experience a number of symptoms, such as:  Moderate-to-severe hot flashes.  Night sweats.  Decrease in sex drive.  Mood swings.  Headaches.  Tiredness.  Irritability.  Memory problems.  Insomnia.  Choosing to treat or not to treat menopausal changes is an individual decision that you make with your health care provider. What should I know about hormone replacement therapy and supplements? Hormone therapy products are effective for treating symptoms that are associated with menopause, such as hot flashes and night sweats. Hormone replacement carries certain risks, especially as you become older. If you are thinking about using estrogen or estrogen with progestin treatments, discuss the benefits and risks with your health care provider. What should I know about heart disease and stroke? Heart disease, heart attack, and stroke become more likely as you age. This may be due, in part, to the hormonal changes that your body experiences during menopause. These can affect how your body processes dietary fats, triglycerides, and cholesterol. Heart attack and stroke are both medical emergencies. There are many things that you can do to help prevent heart disease  and stroke:  Have your blood pressure checked at least every 1-2 years. High blood pressure causes heart disease and increases the risk of stroke.  If you are 53-22 years old, ask your health care provider if you should take aspirin to prevent a heart attack or a stroke.  Do not use any tobacco products, including cigarettes, chewing tobacco, or electronic cigarettes. If you need help quitting, ask your health care provider.  It is important to eat a healthy diet and maintain a healthy weight. ? Be sure to include plenty of vegetables, fruits, low-fat dairy products, and lean protein. ? Avoid eating foods that are high in solid fats, added sugars, or salt (sodium).  Get regular exercise. This is one of the most important things that you can do for your health. ? Try to exercise for at least 150 minutes each week. The type of exercise that you do should increase your heart rate and make you sweat. This is known as moderate-intensity exercise. ? Try to do strengthening exercises at least twice each week. Do these in addition to the moderate-intensity exercise.  Know your numbers.Ask your health care provider to check your cholesterol and your blood glucose. Continue to have your blood tested as directed by your health care provider.  What should I know about cancer screening? There are several types of cancer. Take the following steps to reduce your risk and to catch any cancer development as early as possible. Breast Cancer  Practice breast self-awareness. ? This means understanding how your breasts normally appear and feel. ? It also means doing regular breast self-exams. Let your health care provider know about any changes, no matter how small.  If you are 40  or older, have a clinician do a breast exam (clinical breast exam or CBE) every year. Depending on your age, family history, and medical history, it may be recommended that you also have a yearly breast X-ray (mammogram).  If you  have a family history of breast cancer, talk with your health care provider about genetic screening.  If you are at high risk for breast cancer, talk with your health care provider about having an MRI and a mammogram every year.  Breast cancer (BRCA) gene test is recommended for women who have family members with BRCA-related cancers. Results of the assessment will determine the need for genetic counseling and BRCA1 and for BRCA2 testing. BRCA-related cancers include these types: ? Breast. This occurs in males or females. ? Ovarian. ? Tubal. This may also be called fallopian tube cancer. ? Cancer of the abdominal or pelvic lining (peritoneal cancer). ? Prostate. ? Pancreatic.  Cervical, Uterine, and Ovarian Cancer Your health care provider may recommend that you be screened regularly for cancer of the pelvic organs. These include your ovaries, uterus, and vagina. This screening involves a pelvic exam, which includes checking for microscopic changes to the surface of your cervix (Pap test).  For women ages 21-65, health care providers may recommend a pelvic exam and a Pap test every three years. For women ages 79-65, they may recommend the Pap test and pelvic exam, combined with testing for human papilloma virus (HPV), every five years. Some types of HPV increase your risk of cervical cancer. Testing for HPV may also be done on women of any age who have unclear Pap test results.  Other health care providers may not recommend any screening for nonpregnant women who are considered low risk for pelvic cancer and have no symptoms. Ask your health care provider if a screening pelvic exam is right for you.  If you have had past treatment for cervical cancer or a condition that could lead to cancer, you need Pap tests and screening for cancer for at least 20 years after your treatment. If Pap tests have been discontinued for you, your risk factors (such as having a new sexual partner) need to be  reassessed to determine if you should start having screenings again. Some women have medical problems that increase the chance of getting cervical cancer. In these cases, your health care provider may recommend that you have screening and Pap tests more often.  If you have a family history of uterine cancer or ovarian cancer, talk with your health care provider about genetic screening.  If you have vaginal bleeding after reaching menopause, tell your health care provider.  There are currently no reliable tests available to screen for ovarian cancer.  Lung Cancer Lung cancer screening is recommended for adults 69-62 years old who are at high risk for lung cancer because of a history of smoking. A yearly low-dose CT scan of the lungs is recommended if you:  Currently smoke.  Have a history of at least 30 pack-years of smoking and you currently smoke or have quit within the past 15 years. A pack-year is smoking an average of one pack of cigarettes per day for one year.  Yearly screening should:  Continue until it has been 15 years since you quit.  Stop if you develop a health problem that would prevent you from having lung cancer treatment.  Colorectal Cancer  This type of cancer can be detected and can often be prevented.  Routine colorectal cancer screening usually begins at  age 42 and continues through age 45.  If you have risk factors for colon cancer, your health care provider may recommend that you be screened at an earlier age.  If you have a family history of colorectal cancer, talk with your health care provider about genetic screening.  Your health care provider may also recommend using home test kits to check for hidden blood in your stool.  A small camera at the end of a tube can be used to examine your colon directly (sigmoidoscopy or colonoscopy). This is done to check for the earliest forms of colorectal cancer.  Direct examination of the colon should be repeated every  5-10 years until age 71. However, if early forms of precancerous polyps or small growths are found or if you have a family history or genetic risk for colorectal cancer, you may need to be screened more often.  Skin Cancer  Check your skin from head to toe regularly.  Monitor any moles. Be sure to tell your health care provider: ? About any new moles or changes in moles, especially if there is a change in a mole's shape or color. ? If you have a mole that is larger than the size of a pencil eraser.  If any of your family members has a history of skin cancer, especially at a young age, talk with your health care provider about genetic screening.  Always use sunscreen. Apply sunscreen liberally and repeatedly throughout the day.  Whenever you are outside, protect yourself by wearing long sleeves, pants, a wide-brimmed hat, and sunglasses.  What should I know about osteoporosis? Osteoporosis is a condition in which bone destruction happens more quickly than new bone creation. After menopause, you may be at an increased risk for osteoporosis. To help prevent osteoporosis or the bone fractures that can happen because of osteoporosis, the following is recommended:  If you are 46-71 years old, get at least 1,000 mg of calcium and at least 600 mg of vitamin D per day.  If you are older than age 55 but younger than age 65, get at least 1,200 mg of calcium and at least 600 mg of vitamin D per day.  If you are older than age 54, get at least 1,200 mg of calcium and at least 800 mg of vitamin D per day.  Smoking and excessive alcohol intake increase the risk of osteoporosis. Eat foods that are rich in calcium and vitamin D, and do weight-bearing exercises several times each week as directed by your health care provider. What should I know about how menopause affects my mental health? Depression may occur at any age, but it is more common as you become older. Common symptoms of depression  include:  Low or sad mood.  Changes in sleep patterns.  Changes in appetite or eating patterns.  Feeling an overall lack of motivation or enjoyment of activities that you previously enjoyed.  Frequent crying spells.  Talk with your health care provider if you think that you are experiencing depression. What should I know about immunizations? It is important that you get and maintain your immunizations. These include:  Tetanus, diphtheria, and pertussis (Tdap) booster vaccine.  Influenza every year before the flu season begins.  Pneumonia vaccine.  Shingles vaccine.  Your health care provider may also recommend other immunizations. This information is not intended to replace advice given to you by your health care provider. Make sure you discuss any questions you have with your health care provider. Document Released: 10/05/2005  Document Revised: 03/02/2016 Document Reviewed: 05/17/2015 Elsevier Interactive Patient Education  2018 [employer]  

## 2017-12-19 ENCOUNTER — Telehealth: Payer: Self-pay | Admitting: Nurse Practitioner

## 2017-12-19 NOTE — Telephone Encounter (Signed)
Medical records received through fax 12-19-17.

## 2017-12-23 ENCOUNTER — Ambulatory Visit: Payer: Medicare Other | Admitting: Nurse Practitioner

## 2017-12-27 ENCOUNTER — Ambulatory Visit: Payer: Medicaid Other

## 2018-01-07 ENCOUNTER — Telehealth: Payer: Self-pay

## 2018-01-07 NOTE — Telephone Encounter (Signed)
This medication does not require pre auth. Please let the pharmacy know they need to bill patient's medicare part D. Medicaid will not pay for the full amount; they only pay for remainder once part D has paid.

## 2018-01-07 NOTE — Telephone Encounter (Signed)
Deanna called from covermymeds wants a follow up on  the pre auth for Novolog   And to please call the insurance for the pre auth at [phone]

## 2018-01-07 NOTE — Telephone Encounter (Signed)
Will route to PCP 

## 2018-01-12 ENCOUNTER — Other Ambulatory Visit: Payer: Self-pay | Admitting: Nurse Practitioner

## 2018-01-12 DIAGNOSIS — E1165 Type 2 diabetes mellitus with hyperglycemia: Secondary | ICD-10-CM

## 2018-01-15 MED FILL — NOVOLOG FLEXPEN SYRINGE: 100 | 30 days supply | Qty: 9 | Fill #1

## 2018-01-24 ENCOUNTER — Telehealth: Payer: Self-pay

## 2018-01-24 NOTE — Telephone Encounter (Signed)
-----   Message from Gildardo Pounds, NP sent at 01/23/2018 11:43 PM EDT ----- Regarding: FW: Nephrology Referral  Please have patient come in as soon as possible for labs. Not sure why her labs didn't get done at her last office visit with me though.   Thank you   Brenda Andrews ----- Message ----- From: Ena Dawley Sent: 01/22/2018   9:13 AM To: Gildardo Pounds, NP Subject: Nephrology Referral                            Good Morning   [place] me a message    No recent creatinine labs done on patient. Please get labs and fax them to [phone]. Cannot schedule patient until labs are done. Thank you, Amber. Have a Nice Day

## 2018-01-24 NOTE — Telephone Encounter (Signed)
CMA call to let know patient she needs to call to make a lab appt   Patient was aware and understood   CMA scheduled patient appt for 01/27/18 @ 8:30 am

## 2018-01-26 ENCOUNTER — Other Ambulatory Visit: Payer: Self-pay | Admitting: Nurse Practitioner

## 2018-01-26 DIAGNOSIS — R944 Abnormal results of kidney function studies: Secondary | ICD-10-CM

## 2018-01-27 ENCOUNTER — Ambulatory Visit: Payer: Medicare Other | Attending: Family Medicine

## 2018-01-27 DIAGNOSIS — R944 Abnormal results of kidney function studies: Secondary | ICD-10-CM | POA: Insufficient documentation

## 2018-01-27 NOTE — Progress Notes (Signed)
Patient here for lab visit only 

## 2018-01-28 LAB — CMP14+EGFR
ALT: 20 IU/L (ref 0–32)
AST: 19 IU/L (ref 0–40)
Albumin/Globulin Ratio: 1.3 (ref 1.2–2.2)
Albumin: 4 g/dL (ref 3.5–4.8)
Alkaline Phosphatase: 107 IU/L (ref 39–117)
BUN/Creatinine Ratio: 20 (ref 12–28)
BUN: 31 mg/dL — ABNORMAL HIGH (ref 8–27)
Bilirubin Total: 0.6 mg/dL (ref 0.0–1.2)
CO2: 22 mmol/L (ref 20–29)
Calcium: 9.2 mg/dL (ref 8.7–10.3)
Chloride: 107 mmol/L — ABNORMAL HIGH (ref 96–106)
Creatinine, Ser: 1.53 mg/dL — ABNORMAL HIGH (ref 0.57–1.00)
GFR calc Af Amer: 39 mL/min/1.73 — ABNORMAL LOW
GFR calc non Af Amer: 34 mL/min/1.73 — ABNORMAL LOW
Globulin, Total: 3.1 g/dL (ref 1.5–4.5)
Glucose: 138 mg/dL — ABNORMAL HIGH (ref 65–99)
Potassium: 4.8 mmol/L (ref 3.5–5.2)
Sodium: 143 mmol/L (ref 134–144)
Total Protein: 7.1 g/dL (ref 6.0–8.5)

## 2018-02-03 ENCOUNTER — Telehealth: Payer: Self-pay

## 2018-02-03 NOTE — Telephone Encounter (Signed)
CMA faxed the Lab results of creatinine to [facility].

## 2018-02-18 MED FILL — TRUEplus 5-BEVEL PEN NEEDLE: 31G X 8 MM | 25 days supply | Qty: 100 | Fill #1

## 2018-02-18 MED FILL — NOVOLOG FLEXPEN SYRINGE: 100 | 30 days supply | Qty: 15 | Fill #2

## 2018-02-18 MED FILL — LANTUS SOLOSTAR 100 UNITS/M: 100 | 30 days supply | Qty: 15 | Fill #1

## 2018-03-17 MED FILL — NOVOLOG FLEXPEN SYRINGE: 100 | 30 days supply | Qty: 15 | Fill #3

## 2018-03-17 MED FILL — TRUEplus 5-BEVEL PEN NEEDLE: 31G X 8 MM | 25 days supply | Qty: 100 | Fill #2

## 2018-03-18 ENCOUNTER — Other Ambulatory Visit: Payer: Self-pay

## 2018-03-18 DIAGNOSIS — E1165 Type 2 diabetes mellitus with hyperglycemia: Secondary | ICD-10-CM

## 2018-03-18 MED ORDER — BASAGLAR KWIKPEN 100 UNIT/ML ~~LOC~~ SOPN
40.0000 [IU] | PEN_INJECTOR | Freq: Every day | SUBCUTANEOUS | 12 refills | Status: DC
Start: 2018-03-18 — End: 2019-08-31

## 2018-04-19 ENCOUNTER — Ambulatory Visit (HOSPITAL_COMMUNITY)
Admission: EM | Admit: 2018-04-19 | Discharge: 2018-04-19 | Disposition: A | Discharge status: Home / Self Care | Payer: Medicare Other | Attending: Internal Medicine | Admitting: Internal Medicine

## 2018-04-19 ENCOUNTER — Other Ambulatory Visit: Payer: Self-pay

## 2018-04-19 ENCOUNTER — Encounter (HOSPITAL_COMMUNITY): Payer: Self-pay | Admitting: *Deleted

## 2018-04-19 DIAGNOSIS — M5442 Lumbago with sciatica, left side: Secondary | ICD-10-CM | POA: Insufficient documentation

## 2018-04-19 DIAGNOSIS — I951 Orthostatic hypotension: Secondary | ICD-10-CM | POA: Diagnosis not present

## 2018-04-19 DIAGNOSIS — R42 Dizziness and giddiness: Secondary | ICD-10-CM | POA: Diagnosis not present

## 2018-04-19 DIAGNOSIS — E1122 Type 2 diabetes mellitus with diabetic chronic kidney disease: Secondary | ICD-10-CM | POA: Insufficient documentation

## 2018-04-19 DIAGNOSIS — Z882 Allergy status to sulfonamides status: Secondary | ICD-10-CM | POA: Diagnosis not present

## 2018-04-19 DIAGNOSIS — E785 Hyperlipidemia, unspecified: Secondary | ICD-10-CM | POA: Diagnosis not present

## 2018-04-19 DIAGNOSIS — M1990 Unspecified osteoarthritis, unspecified site: Secondary | ICD-10-CM

## 2018-04-19 DIAGNOSIS — R531 Weakness: Secondary | ICD-10-CM

## 2018-04-19 DIAGNOSIS — E11319 Type 2 diabetes mellitus with unspecified diabetic retinopathy without macular edema: Secondary | ICD-10-CM | POA: Insufficient documentation

## 2018-04-19 DIAGNOSIS — N183 Chronic kidney disease, stage 3 (moderate): Secondary | ICD-10-CM | POA: Insufficient documentation

## 2018-04-19 DIAGNOSIS — R7989 Other specified abnormal findings of blood chemistry: Secondary | ICD-10-CM | POA: Insufficient documentation

## 2018-04-19 DIAGNOSIS — I129 Hypertensive chronic kidney disease with stage 1 through stage 4 chronic kidney disease, or unspecified chronic kidney disease: Secondary | ICD-10-CM | POA: Insufficient documentation

## 2018-04-19 DIAGNOSIS — Z79899 Other long term (current) drug therapy: Secondary | ICD-10-CM | POA: Diagnosis not present

## 2018-04-19 DIAGNOSIS — M5432 Sciatica, left side: Secondary | ICD-10-CM

## 2018-04-19 DIAGNOSIS — E1142 Type 2 diabetes mellitus with diabetic polyneuropathy: Secondary | ICD-10-CM | POA: Diagnosis not present

## 2018-04-19 DIAGNOSIS — M542 Cervicalgia: Secondary | ICD-10-CM | POA: Diagnosis not present

## 2018-04-19 DIAGNOSIS — Z794 Long term (current) use of insulin: Secondary | ICD-10-CM | POA: Insufficient documentation

## 2018-04-19 HISTORY — DX: Unspecified osteoarthritis, unspecified site: M19.90

## 2018-04-19 LAB — POCT I-STAT, CHEM 8
BUN: 30 mg/dL — ABNORMAL HIGH (ref 8–23)
CALCIUM ION: 1.11 mmol/L — AB (ref 1.15–1.40)
CHLORIDE: 105 mmol/L (ref 98–111)
Creatinine, Ser: 1.8 mg/dL — ABNORMAL HIGH (ref 0.44–1.00)
GLUCOSE: 200 mg/dL — AB (ref 70–99)
HCT: 35 % — ABNORMAL LOW (ref 36.0–46.0)
Hemoglobin: 11.9 g/dL — ABNORMAL LOW (ref 12.0–15.0)
POTASSIUM: 4.5 mmol/L (ref 3.5–5.1)
SODIUM: 140 mmol/L (ref 135–145)
TCO2: 22 mmol/L (ref 22–32)

## 2018-04-19 LAB — POCT URINALYSIS DIP (DEVICE)
Bilirubin Urine: NEGATIVE
GLUCOSE, UA: NEGATIVE mg/dL
Hgb urine dipstick: NEGATIVE
KETONES UR: NEGATIVE mg/dL
Nitrite: NEGATIVE
Protein, ur: NEGATIVE mg/dL
SPECIFIC GRAVITY, URINE: 1.015 (ref 1.005–1.030)
Urobilinogen, UA: 0.2 mg/dL (ref 0.0–1.0)
pH: 7 (ref 5.0–8.0)

## 2018-04-19 NOTE — ED Triage Notes (Signed)
Reports having had constipation over past few days; pt states bought what she describes as possibly mag citrate, and had good results.  Ever since taking the med, having increased feeling of weakness, and off balance.

## 2018-04-19 NOTE — ED Notes (Signed)
Bed: [facility]01 Expected date:  Expected time:  Means of arrival:  Comments: appt 

## 2018-04-19 NOTE — Discharge Instructions (Addendum)
Increase water intake to improve hydration.  Go slowly with transition from sitting to standing.  Tylenol as needed for arthritis pain Please follow up with primary care provider in the next week for recheck as may recheck labs as well.  If any worsening of symptoms, headache, confusion, weakness, dizziness, falls or otherwise worsening please go to the Er.

## 2018-04-19 NOTE — ED Provider Notes (Signed)
[facility]    CSN: [id number] Arrival date & time: 04/19/18  1544     History   Chief Complaint Chief Complaint  Patient presents with  . Appointment    1529  . Weakness    HPI Neeka Rieth is a 72 y.o. female.   Alexsa presents with her family with multiple complaints. She complains of feel weak with intermittent dizziness which has been ongoing for a few days. Worse with movements or activity. No fall or head injury. Has bilateral neck pain which radiates to ears. No pain with movement of neck. Has been ongoing for two weeks. It feels like a "tension." denies any previous similar. Also with left sided sciatica, this is not new. Takes gabapentin, does not take anything else for this. Was constipated and yesterday took a laxitive she bought at [employer] and she has since been able to have a bowel movement. No diarrhea. No nausea or vomiting. No abdominal pain. Eating and drinking. No one sided weakness, confusion, dysphagia, or headache. No blood in stool. Has been on htn meds in the past but was taken off. No history of stroke. No chest pain. Hx of arthritis, ckd, dm, ibs, hyperlipidemia, colitis, orthostatic dizziness, anemia. She does follow with [facility] and has a PCP.   Spanish video interpreter used to collect history and physical exam.    ROS per HPI.      Past Medical History:  Diagnosis Date  . Arthritis   . Chronic kidney disease 2002  . COLITIS 03/19/2008   Qualifier: Diagnosis of  By: Marland Mcalpine    . Diabetes mellitus 1982  . Hyperlipidemia 2013  . IBS (irritable bowel syndrome)     Patient Active Problem List   Diagnosis Date Noted  . Diabetic polyneuropathy associated with type 2 diabetes mellitus ([place]) 07/17/2017  . Osteopenia 06/29/2016  . Encounter for Medicare annual wellness exam 06/25/2016  . Abdominal pain, LUQ 06/25/2016  . Ingrowing toenail 01/04/2016  . CKD (chronic kidney disease) stage 3, GFR 30-59  ml/min (HCC) 12/06/2015  . 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 Varicose veins of both lower extremities with complications 96/78/9381  . Seasonal allergies 05/30/2015  . Poor vision 03/10/2015  . Onychomycosis 12/14/2014  . Vitamin D deficiency 10/17/2014  . Orthostatic dizziness 10/15/2014  . Right hand pain 10/15/2014  . Numbness of fingers of both hands 10/15/2014  . Depression 09/07/2014  . Diabetic retinopathy ([place]) 11/24/2013  . DM (diabetes mellitus), type 2, uncontrolled ([place]) 03/19/2008  . Anemia 03/19/2008  . ARTHRITIS 03/19/2008  . OSTEOPOROSIS 03/19/2008    History reviewed. No pertinent surgical history.  OB History    Gravida  7   Para  7   Term  7   Preterm      AB      Living  7     SAB      TAB      Ectopic      Multiple      Live Births               Home Medications    Prior to Admission medications   Medication Sig Start Date End Date Taking? Authorizing Provider  atorvastatin (LIPITOR) 40 MG tablet TAKE 1 TABLET BY MOUTH DAILY AT 6 PM 01/13/18  Yes Gildardo Pounds, NP  Blood Glucose Monitoring Suppl (ACCU-CHEK AVIVA PLUS) w/Device KIT Use as directed - E11.65 01/03/16  Yes Boykin Nearing, MD  cholecalciferol (  VITAMIN D) 1000 units tablet Take 1,000 Units by mouth 2 (two) times daily.   Yes [provider]  gabapentin (NEURONTIN) 300 MG capsule Take 1 capsule (300 mg total) by mouth at bedtime. 10/16/17 04/19/18 Yes Gildardo Pounds, NP  insulin aspart (NOVOLOG FLEXPEN) 100 UNIT/ML FlexPen Inject 10 Units into the skin 3 (three) times daily with meals. 12/18/17  Yes Gildardo Pounds, NP  Insulin Glargine (BASAGLAR KWIKPEN) 100 UNIT/ML SOPN Inject 0.4 mLs (40 Units total) into the skin daily at 10 pm. 03/18/18  Yes Gildardo Pounds, NP  ACCU-CHEK AVIVA PLUS test strip USE AS DIRECTED TO TEST BLOOD SUGAR THREE TIMES DAILY 03/26/16   Funches, Adriana Mccallum, MD  ACCU-CHEK FASTCLIX LANCETS MISC USE 3 TIMES PER DAY  BEFORE MEALS AND EVERY NIGHT AT BEDTIME 05/07/16   Funches, Adriana Mccallum, MD  acyclovir (ZOVIRAX) 400 MG tablet Take 1 tablet (400 mg total) by mouth 3 (three) times daily. Patient not taking: Reported on 10/16/2017 08/03/16   Boykin Nearing, MD  diclofenac sodium (VOLTAREN) 1 % GEL Apply 2 g topically 4 (four) times daily. Patient not taking: Reported on 12/18/2017 10/16/17   Gildardo Pounds, NP  Insulin Pen Needle (B-D ULTRAFINE III SHORT PEN) 31G X 8 MM MISC USE AS DIRECTED FOUR TIMES DAILY 12/18/17   Gildardo Pounds, NP  Multiple Vitamins-Minerals (CENTRUM SILVER 50+WOMEN PO) Take by mouth.    [provider]    Family History Family History  Problem Relation Age of Onset  . Diabetes Brother   . Diabetes Brother   . Diabetes Brother   . Diabetes Brother     Social History Social History   Tobacco Use  . Smoking status: Never Smoker  . Smokeless tobacco: Never Used  Substance Use Topics  . Alcohol use: No  . Drug use: No     Allergies   Pollen extract-tree extract and Sulfonamide derivatives   Review of Systems Review of Systems   Physical Exam Triage Vital Signs ED Triage Vitals [04/19/18 1618]  Enc Vitals Group     BP (!) 147/74     Pulse Rate 84     Resp 16     Temp 98.8 F (37.1 C)     Temp Source Oral     SpO2 96 %     Weight      Height      Head Circumference      Peak Flow      Pain Score      Pain Loc      Pain Edu?      Excl. in [place]?    Orthostatic VS for the past 24 hrs:  BP- Lying Pulse- Lying BP- Sitting Pulse- Sitting BP- Standing at 0 minutes Pulse- Standing at 0 minutes  04/19/18 1744 136/74 85 145/73 87 124/71 91    Updated Vital Signs BP (!) 147/74   Pulse 84   Temp 98.8 F (37.1 C) (Oral)   Resp 16   SpO2 96%   Physical Exam  Constitutional: She is oriented to person, place, and time. She appears well-developed and well-nourished. No distress.  HENT:  Head: Normocephalic and atraumatic.  Right Ear: Hearing, tympanic  membrane and ear canal normal.  Left Ear: Hearing, tympanic membrane and ear canal normal.  Neck: Tracheal tenderness and muscular tenderness present. No spinous process tenderness present. No neck rigidity. Normal range of motion present.    Cardiovascular: Normal rate, regular rhythm and normal heart sounds.  Pulmonary/Chest:  Effort normal and breath sounds normal.  Abdominal: Soft. There is no tenderness.  Musculoskeletal:  Mild left sided low back pain with palpation and with rom   Neurological: She is alert and oriented to person, place, and time. She has normal strength. No cranial nerve deficit or sensory deficit. She displays a negative Romberg sign. Coordination normal.  Skin: Skin is warm and dry.   ekg nsr with PAC's, no other acute changes; rate of 85  UC Treatments / Results  Labs (all labs ordered are listed, but only abnormal results are displayed) Labs Reviewed  POCT I-STAT, CHEM 8 - Abnormal; Notable for the following components:      Result Value   BUN 30 (*)    Creatinine, Ser 1.80 (*)    Glucose, Bld 200 (*)    Calcium, Ion 1.11 (*)    Hemoglobin 11.9 (*)    HCT 35.0 (*)    All other components within normal limits  POCT URINALYSIS DIP (DEVICE) - Abnormal; Notable for the following components:   Leukocytes, UA SMALL (*)    All other components within normal limits  URINE CULTURE    EKG None  Radiology No results found.  Procedures Procedures (including critical care time)  Medications Ordered in UC Medications - No data to display  Initial Impression / Assessment and Plan / UC Course  I have reviewed the triage vital signs and the nursing notes.  Pertinent labs & imaging results that were available during my care of the patient were reviewed by me and considered in my medical decision making (see chart for details).     Neck pain for the past two weeks, has not been worsening. Occasional dizziness with activity. History of orthostatic  dizziness. Benign neurological exam. Noted creatinine at 1.8 here today, up from 1.53 01/27/18. No diarrhea. No abdominal pain. Eating and drinking. Ambulatory without difficulty. Urine with leuks, culture pending. She is not on a blood thinner.  Noted orthostatic hypotension with sitting to standing. Is listed in history as orthostatic. Recommend increase fluid intake, slow transition with positioning. Follow up with PCP in the next week and with nephrology as scheduled. If develop worsening of neck pain, weakness, dizziness, fall, headache, or other worsening conditions to return or go to the ER. Patient and family verbalized understanding and agreeable to plan.     Final Clinical Impressions(s) / UC Diagnoses   Final diagnoses:  Dizziness  Elevated serum creatinine  Acute left-sided low back pain with left-sided sciatica  Orthostatic hypotension   Discharge Instructions   None    ED Prescriptions    None     Controlled Substance Prescriptions [place] Controlled Substance Registry consulted? Not Applicable   Burky, Natalie B, NP 04/19/18 1750  

## 2018-04-19 NOTE — ED Triage Notes (Signed)
CBG = 110 this AM.  Also c/o bilat neck pain

## 2018-04-20 LAB — URINE CULTURE: Culture: <10000 — AB

## 2018-04-23 DIAGNOSIS — K5901 Slow transit constipation: Secondary | ICD-10-CM | POA: Diagnosis not present

## 2018-04-23 DIAGNOSIS — R202 Paresthesia of skin: Secondary | ICD-10-CM | POA: Diagnosis not present

## 2018-04-23 DIAGNOSIS — N644 Mastodynia: Secondary | ICD-10-CM | POA: Diagnosis not present

## 2018-04-23 DIAGNOSIS — Z1329 Encounter for screening for other suspected endocrine disorder: Secondary | ICD-10-CM | POA: Diagnosis not present

## 2018-04-23 DIAGNOSIS — R42 Dizziness and giddiness: Secondary | ICD-10-CM | POA: Diagnosis not present

## 2018-04-23 DIAGNOSIS — Z882 Allergy status to sulfonamides status: Secondary | ICD-10-CM | POA: Diagnosis not present

## 2018-04-23 DIAGNOSIS — Z130 Encounter for screening for diseases of the blood and blood-forming organs and certain disorders involving the immune mechanism: Secondary | ICD-10-CM | POA: Diagnosis not present

## 2018-04-23 DIAGNOSIS — M542 Cervicalgia: Secondary | ICD-10-CM | POA: Diagnosis not present

## 2018-04-23 DIAGNOSIS — H47293 Other optic atrophy, bilateral: Secondary | ICD-10-CM | POA: Diagnosis not present

## 2018-04-23 DIAGNOSIS — Z881 Allergy status to other antibiotic agents status: Secondary | ICD-10-CM | POA: Diagnosis not present

## 2018-04-23 DIAGNOSIS — E1159 Type 2 diabetes mellitus with other circulatory complications: Secondary | ICD-10-CM | POA: Diagnosis not present

## 2018-04-23 DIAGNOSIS — Z961 Presence of intraocular lens: Secondary | ICD-10-CM | POA: Diagnosis not present

## 2018-04-23 DIAGNOSIS — E7800 Pure hypercholesterolemia, unspecified: Secondary | ICD-10-CM | POA: Diagnosis not present

## 2018-04-23 DIAGNOSIS — H43813 Vitreous degeneration, bilateral: Secondary | ICD-10-CM | POA: Diagnosis not present

## 2018-04-23 DIAGNOSIS — E1122 Type 2 diabetes mellitus with diabetic chronic kidney disease: Secondary | ICD-10-CM | POA: Diagnosis not present

## 2018-04-23 DIAGNOSIS — N183 Chronic kidney disease, stage 3 (moderate): Secondary | ICD-10-CM | POA: Diagnosis not present

## 2018-04-23 DIAGNOSIS — Z7689 Persons encountering health services in other specified circumstances: Secondary | ICD-10-CM | POA: Diagnosis not present

## 2018-04-23 DIAGNOSIS — E559 Vitamin D deficiency, unspecified: Secondary | ICD-10-CM | POA: Diagnosis not present

## 2018-04-23 DIAGNOSIS — Z794 Long term (current) use of insulin: Secondary | ICD-10-CM | POA: Diagnosis not present

## 2018-04-23 DIAGNOSIS — E113523 Type 2 diabetes mellitus with proliferative diabetic retinopathy with traction retinal detachment involving the macula, bilateral: Secondary | ICD-10-CM | POA: Diagnosis not present

## 2018-04-25 ENCOUNTER — Other Ambulatory Visit: Payer: Self-pay | Admitting: Family Medicine

## 2018-04-25 DIAGNOSIS — N644 Mastodynia: Secondary | ICD-10-CM

## 2018-04-29 ENCOUNTER — Other Ambulatory Visit: Payer: Self-pay | Admitting: Family Medicine

## 2018-04-29 ENCOUNTER — Ambulatory Visit
Admission: RE | Admit: 2018-04-29 | Discharge: 2018-04-29 | Disposition: A | Discharge status: Home / Self Care | Payer: Medicare Other | Source: Ambulatory Visit | Attending: Family Medicine | Admitting: Family Medicine

## 2018-04-29 DIAGNOSIS — M5442 Lumbago with sciatica, left side: Secondary | ICD-10-CM

## 2018-04-29 DIAGNOSIS — M542 Cervicalgia: Secondary | ICD-10-CM

## 2018-04-29 DIAGNOSIS — M47816 Spondylosis without myelopathy or radiculopathy, lumbar region: Secondary | ICD-10-CM | POA: Diagnosis not present

## 2018-05-01 ENCOUNTER — Inpatient Hospital Stay: Payer: Medicare Other

## 2018-05-02 ENCOUNTER — Ambulatory Visit
Admission: RE | Admit: 2018-05-02 | Discharge: 2018-05-02 | Disposition: A | Discharge status: Home / Self Care | Payer: Medicare Other | Source: Ambulatory Visit | Attending: Family Medicine | Admitting: Family Medicine

## 2018-05-02 DIAGNOSIS — N644 Mastodynia: Secondary | ICD-10-CM | POA: Diagnosis not present

## 2018-05-02 DIAGNOSIS — R928 Other abnormal and inconclusive findings on diagnostic imaging of breast: Secondary | ICD-10-CM | POA: Diagnosis not present

## 2018-05-30 DIAGNOSIS — N183 Chronic kidney disease, stage 3 (moderate): Secondary | ICD-10-CM | POA: Diagnosis not present

## 2018-05-30 DIAGNOSIS — E1159 Type 2 diabetes mellitus with other circulatory complications: Secondary | ICD-10-CM | POA: Diagnosis not present

## 2018-05-30 DIAGNOSIS — M5442 Lumbago with sciatica, left side: Secondary | ICD-10-CM | POA: Diagnosis not present

## 2018-05-30 DIAGNOSIS — K5901 Slow transit constipation: Secondary | ICD-10-CM | POA: Diagnosis not present

## 2018-05-30 DIAGNOSIS — Z794 Long term (current) use of insulin: Secondary | ICD-10-CM | POA: Diagnosis not present

## 2018-05-30 DIAGNOSIS — F320 Major depressive disorder, single episode, mild: Secondary | ICD-10-CM | POA: Diagnosis not present

## 2018-06-06 DIAGNOSIS — I129 Hypertensive chronic kidney disease with stage 1 through stage 4 chronic kidney disease, or unspecified chronic kidney disease: Secondary | ICD-10-CM | POA: Diagnosis not present

## 2018-06-06 DIAGNOSIS — Z23 Encounter for immunization: Secondary | ICD-10-CM | POA: Diagnosis not present

## 2018-06-06 DIAGNOSIS — N189 Chronic kidney disease, unspecified: Secondary | ICD-10-CM | POA: Diagnosis not present

## 2018-06-06 DIAGNOSIS — N183 Chronic kidney disease, stage 3 (moderate): Secondary | ICD-10-CM | POA: Diagnosis not present

## 2018-06-06 DIAGNOSIS — M1990 Unspecified osteoarthritis, unspecified site: Secondary | ICD-10-CM | POA: Diagnosis not present

## 2018-06-06 DIAGNOSIS — N2581 Secondary hyperparathyroidism of renal origin: Secondary | ICD-10-CM | POA: Diagnosis not present

## 2018-06-06 DIAGNOSIS — D631 Anemia in chronic kidney disease: Secondary | ICD-10-CM | POA: Diagnosis not present

## 2018-06-06 DIAGNOSIS — E1121 Type 2 diabetes mellitus with diabetic nephropathy: Secondary | ICD-10-CM | POA: Diagnosis not present

## 2018-06-11 DIAGNOSIS — Z794 Long term (current) use of insulin: Secondary | ICD-10-CM | POA: Diagnosis not present

## 2018-06-11 DIAGNOSIS — E1142 Type 2 diabetes mellitus with diabetic polyneuropathy: Secondary | ICD-10-CM | POA: Diagnosis not present

## 2018-06-11 DIAGNOSIS — K30 Functional dyspepsia: Secondary | ICD-10-CM | POA: Diagnosis not present

## 2018-06-11 DIAGNOSIS — E11319 Type 2 diabetes mellitus with unspecified diabetic retinopathy without macular edema: Secondary | ICD-10-CM | POA: Diagnosis not present

## 2018-06-11 DIAGNOSIS — N183 Chronic kidney disease, stage 3 (moderate): Secondary | ICD-10-CM | POA: Diagnosis not present

## 2018-06-23 ENCOUNTER — Encounter: Payer: Self-pay | Admitting: Internal Medicine

## 2018-07-02 ENCOUNTER — Other Ambulatory Visit: Payer: Self-pay

## 2018-07-02 ENCOUNTER — Encounter: Payer: Self-pay | Admitting: Physical Therapy

## 2018-07-02 ENCOUNTER — Ambulatory Visit: Payer: Medicare Other | Attending: Family Medicine | Admitting: Physical Therapy

## 2018-07-02 DIAGNOSIS — G8929 Other chronic pain: Secondary | ICD-10-CM | POA: Diagnosis not present

## 2018-07-02 DIAGNOSIS — M6281 Muscle weakness (generalized): Secondary | ICD-10-CM | POA: Diagnosis not present

## 2018-07-02 DIAGNOSIS — M542 Cervicalgia: Secondary | ICD-10-CM | POA: Diagnosis not present

## 2018-07-02 DIAGNOSIS — M545 Low back pain: Secondary | ICD-10-CM | POA: Diagnosis not present
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
## 2018-07-09 ENCOUNTER — Encounter: Payer: Self-pay | Admitting: Physical Therapy

## 2018-07-09 ENCOUNTER — Ambulatory Visit: Payer: Medicare Other | Admitting: Physical Therapy

## 2018-07-09 DIAGNOSIS — M6281 Muscle weakness (generalized): Secondary | ICD-10-CM | POA: Diagnosis not present

## 2018-07-09 DIAGNOSIS — M545 Low back pain, unspecified: Secondary | ICD-10-CM

## 2018-07-09 DIAGNOSIS — G8929 Other chronic pain: Secondary | ICD-10-CM | POA: Diagnosis not present

## 2018-07-09 DIAGNOSIS — M542 Cervicalgia: Secondary | ICD-10-CM | POA: Diagnosis not present

## 2018-07-09 NOTE — Therapy (Signed)
[facility] [address], [place], [place], [address] Phone: [phone]   Fax:  [phone]  Physical Therapy Treatment  Patient Details  Name: Brenda Andrews MRN: [id number] Date of Birth: 09/02/1945 Referring Provider (PT): Kendall Flack, MD   Encounter Date: 07/09/2018  PT End of Session - 07/09/18 0844    Visit Number  2    Date for PT Re-Evaluation  08/27/18    Authorization Type  Medicare/Medicaid    Authorization Time Period  07/02/18-08/27/18    PT Start Time  0845    PT Stop Time  0925    PT Time Calculation (min)  40 min    Activity Tolerance  Patient tolerated treatment well    Behavior During Therapy  [facility] for tasks assessed/performed       Past Medical History:  Diagnosis Date  . Arthritis   . Chronic kidney disease 2002  . COLITIS 03/19/2008   Qualifier: Diagnosis of  By: Marland Mcalpine    . Diabetes mellitus 1982  . Hyperlipidemia 2013  . IBS (irritable bowel syndrome)     History reviewed. No pertinent surgical history.  There were no vitals filed for this visit.  Subjective Assessment - 07/09/18 0918    Subjective  My back feels not bad this AM.     Patient is accompained by:  Interpreter    Limitations  Sitting;Lifting;Walking;House hold activities    How long can you sit comfortably?  1 hour    Currently in Pain?  No/denies    Multiple Pain Sites  No                       [facility] Adult PT Treatment/Exercise - 07/09/18 0001      Self-Care   Self-Care  --   Sitting lumbar support, decompression position in supine     Lumbar Exercises: Aerobic   Nustep  L1 x 5 min   PTA present to monitor            PT Education - 07/09/18 0918    Education Details  Initial HEP started    [employer]) Educated  Patient   interpreter present to asssit   Methods  Explanation;Demonstration;Tactile cues;Verbal cues;Handout    Comprehension  Returned  demonstration;Verbalized understanding       PT Short Term Goals - 07/02/18 1810      PT SHORT TERM GOAL #1   Title  Pt will be ind in initial HEP targeting flexibility, ROM, UE/LE strengthening, and cervical and lumbar stabilization    Time  4    Period  Weeks    Status  New    Target Date  07/30/18      PT SHORT TERM GOAL #2   Title  Pt will be able to perform active daily tasks such as cooking or cleaning > or = 15 min before needing a break demonstrating greater endurance.    Time  5    Period  Weeks    Status  New    Target Date  08/06/18      PT SHORT TERM GOAL #3   Title  Pt will receive at least a 4-/5 in bil UEs and LEs so that she will be able to perform more daily tasks with less pain.    Time  5    Period  Weeks    Status  New    Target Date  08/06/18  PT Long Term Goals - 07/02/18 1814      PT LONG TERM GOAL #1   Title  Pt will be ind in advanced HEP    Time  8    Period  Weeks    Status  New    Target Date  08/27/18      PT LONG TERM GOAL #2   Title  Pt will receive at least a 4/5 for bil UE and LE strength tests to support greater activity tolerance.    Time  8    Period  Weeks    Status  New    Target Date  08/27/18      PT LONG TERM GOAL #3   Title  Pt will be able to perform at least 30 min of active daily tasks before needing a break to increase household and community activity participation.    Time  8    Period  Weeks    Status  New    Target Date  08/27/18      PT LONG TERM GOAL #4   Title  Pt will report at least 50% improvement in neck and back pain.    Time  8    Period  Weeks    Status  New    Target Date  08/27/18      PT LONG TERM GOAL #5   Title  Pt will be able to perform 10 step ups and 10 step downs with single UE support safely to simulate her flight of stairs at home.    Time  8    Period  Weeks    Status  New    Target Date  08/27/18            Plan - 07/09/18 0917    Clinical Impression Statement  Pt  was instructed in initial HEP for lumbar/hip/ cervical ROM and flexibility exercises. She was also educated in seated lumbar support ( how to) use of heat to low back, and the supine decompresison position.  There was no pain in any exercise, although pt did benefit from use of towel to assist her Rt shoulder with single knee to chest and the cross over stretch.     Clinical Impairments Affecting Rehab Potential  complex medical history, past Rt shoulder ORIF with limited ROM and strength, Pt reports malaise/fatigue, limited endurance    PT Frequency  2x / week    PT Duration  8 weeks    PT Treatment/Interventions  ADLs/Self Care Home Management;Cryotherapy;Electrical Stimulation;Moist Heat;Traction;Stair training;Gait training;Functional mobility training;Therapeutic activities;Therapeutic exercise;Balance training;Neuromuscular re-education;Patient/family education;Manual techniques;Passive range of motion;Dry needling;Taping;Spinal Manipulations;Joint Manipulations    PT Next Visit Plan  Review HEP, begin gentle strength to LE, core and postural muscles.     PT Home Exercise Plan  [email or web address]    Consulted and Agree with Plan of Care  Patient       Patient will benefit from skilled therapeutic intervention in order to improve the following deficits and impairments:  Dizziness, Improper body mechanics, Pain, Decreased mobility, Postural dysfunction, Decreased activity tolerance, Decreased endurance, Decreased range of motion, Decreased strength, Hypomobility, Impaired UE functional use, Decreased balance, Impaired flexibility  Visit Diagnosis: Cervicalgia  Chronic midline low back pain, unspecified whether sciatica present  Muscle weakness (generalized)     Problem List Patient Active Problem List   Diagnosis Date Noted  . Diabetic polyneuropathy associated with type 2 diabetes mellitus ([place]) 07/17/2017  . Osteopenia 06/29/2016  . Encounter for Medicare annual  wellness exam 06/25/2016  .  Abdominal pain, LUQ 06/25/2016  . Ingrowing toenail 01/04/2016  . CKD (chronic kidney disease) stage 3, GFR 30-59 ml/min (HCC) 12/06/2015  . 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 Varicose veins of both lower extremities with complications 98/92/1194  . Seasonal allergies 05/30/2015  . Poor vision 03/10/2015  . Onychomycosis 12/14/2014  . Vitamin D deficiency 10/17/2014  . Orthostatic dizziness 10/15/2014  . Right hand pain 10/15/2014  . Numbness of fingers of both hands 10/15/2014  . Depression 09/07/2014  . Diabetic retinopathy ([place]) 11/24/2013  . DM (diabetes mellitus), type 2, uncontrolled ([place]) 03/19/2008  . Anemia 03/19/2008  . ARTHRITIS 03/19/2008  . OSTEOPOROSIS 03/19/2008    Theodus Ran, PTA 07/09/2018, 9:32 AM  [facility] [address] [place], [place], [address] Phone: [phone]   Fax:  [phone]  Name: Jaszmine Navejas MRN: [id number] Date of Birth: 05-31-1946  Access Code: [email or web address]  URL: [email or web address]  Date: 07/09/2018  Prepared by: Myrene Galas   Exercises  Supine Lower Trunk Rotation - 3 reps - 2 sets - 20 hold - 2x daily - 7x weekly  Hooklying Single Knee to Chest - 2 reps - 2 sets - 20 hold - 2x daily - 7x weekly  Supine Piriformis Stretch with Foot on Ground - 2 reps - 2 sets - 20 hold - 2x daily - 7x weekly  Supine Cervical Rotation AROM on Pillow - 10 reps - 1 sets - 2x daily - 7x weekly  Seated Transversus Abdominis Bracing - 10 reps - 1 sets - 5 hold - 1x daily - 7x weekly

## 2018-07-11 ENCOUNTER — Encounter: Payer: Self-pay | Admitting: Physical Therapy

## 2018-07-11 ENCOUNTER — Ambulatory Visit: Payer: Medicare Other | Admitting: Physical Therapy

## 2018-07-11 DIAGNOSIS — G8929 Other chronic pain: Secondary | ICD-10-CM | POA: Diagnosis not present

## 2018-07-11 DIAGNOSIS — M545 Low back pain, unspecified: Secondary | ICD-10-CM

## 2018-07-11 DIAGNOSIS — M6281 Muscle weakness (generalized): Secondary | ICD-10-CM

## 2018-07-11 DIAGNOSIS — M542 Cervicalgia: Secondary | ICD-10-CM

## 2018-07-11 NOTE — Therapy (Signed)
[facility] [address], [place], [place], [address] Phone: [phone]   Fax:  [phone]  Physical Therapy Treatment  Patient Details  Name: Brenda Andrews MRN: [id number] Date of Birth: [birth year] Referring Provider (PT): Kendall Flack, MD   Encounter Date: 07/11/2018  PT End of Session - 07/11/18 0843    Visit Number  3    Date for PT Re-Evaluation  08/27/18    Authorization Type  Medicare/Medicaid    Authorization Time Period  07/02/18-08/27/18    PT Start Time  0840    PT Stop Time  0918    PT Time Calculation (min)  38 min    Activity Tolerance  Patient tolerated treatment well    Behavior During Therapy  [facility] for tasks assessed/performed       Past Medical History:  Diagnosis Date  . Arthritis   . Chronic kidney disease 2002  . COLITIS 03/19/2008   Qualifier: Diagnosis of  By: Marland Mcalpine    . Diabetes mellitus 1982  . Hyperlipidemia 2013  . IBS (irritable bowel syndrome)     History reviewed. No pertinent surgical history.  There were no vitals filed for this visit.  Subjective Assessment - 07/11/18 0916    Subjective  No pain, doing my exercises. I brought my tablet so you can show me how to use for my exercises.     Currently in Pain?  No/denies    Multiple Pain Sites  No                       [facility] Adult PT Treatment/Exercise - 07/11/18 0001      Lumbar Exercises: Stretches   Single Knee to Chest Stretch  Right;Left;2 reps;10 seconds    Lower Trunk Rotation  --   rocking 10x, then 10 sec hold 2x bil   Other Lumbar Stretch Exercise  lateral hip stretch bil 2x 10 sec      Lumbar Exercises: Aerobic   Nustep  L1 x 8 min   PTA and interpreter present to discuss status     Lumbar Exercises: Supine   Bridge  --   6x, gave for HEP   Straight Leg Raise  --   6x bil gave for HEP   Other Supine Lumbar Exercises  Cervical AROm for rotation 10x slowly              PT Education - 07/11/18 0902    Education Details  Added to HEP: supine bridge, SLR, and sitting cervical AROM, helped pt navigate Medbridge on her tablet that she brought today.    Person(s) Educated  Patient;Other (comment)   interpreter   Methods  Explanation;Demonstration;Tactile cues;Verbal cues;Handout    Comprehension  Returned demonstration;Verbalized understanding       PT Short Term Goals - 07/11/18 0900      PT SHORT TERM GOAL #1   Title  Pt will be ind in initial HEP targeting flexibility, ROM, UE/LE strengthening, and cervical and lumbar stabilization    Time  4    Period  Weeks    Status  Achieved        PT Long Term Goals - 07/02/18 1814      PT LONG TERM GOAL #1   Title  Pt will be ind in advanced HEP    Time  8    Period  Weeks    Status  New    Target Date  08/27/18  PT LONG TERM GOAL #2   Title  Pt will receive at least a 4/5 for bil UE and LE strength tests to support greater activity tolerance.    Time  8    Period  Weeks    Status  New    Target Date  08/27/18      PT LONG TERM GOAL #3   Title  Pt will be able to perform at least 30 min of active daily tasks before needing a break to increase household and community activity participation.    Time  8    Period  Weeks    Status  New    Target Date  08/27/18      PT LONG TERM GOAL #4   Title  Pt will report at least 50% improvement in neck and back pain.    Time  8    Period  Weeks    Status  New    Target Date  08/27/18      PT LONG TERM GOAL #5   Title  Pt will be able to perform 10 step ups and 10 step downs with single UE support safely to simulate her flight of stairs at home.    Time  8    Period  Weeks    Status  New    Target Date  08/27/18            Plan - 07/11/18 0843    Clinical Impression Statement  Pt is independent in her initial HEP after review today. She mainly needed verbal cuing to hold her stretches and not bounce. She brought her  tablet in for [place] to help her find it so she can use the video component.  No pain with any exercise.     Rehab Potential  Good    Clinical Impairments Affecting Rehab Potential  complex medical history, past Rt shoulder ORIF with limited ROM and strength, Pt reports malaise/fatigue, limited endurance    PT Frequency  2x / week    PT Duration  8 weeks    PT Treatment/Interventions  ADLs/Self Care Home Management;Cryotherapy;Electrical Stimulation;Moist Heat;Traction;Stair training;Gait training;Functional mobility training;Therapeutic activities;Therapeutic exercise;Balance training;Neuromuscular re-education;Patient/family education;Manual techniques;Passive range of motion;Dry needling;Taping;Spinal Manipulations;Joint Manipulations    PT Next Visit Plan  Review HEP, begin gentle strength to LE, core and postural muscles.     PT Home Exercise Plan  [email or web address]    Consulted and Agree with Plan of Care  Patient       Patient will benefit from skilled therapeutic intervention in order to improve the following deficits and impairments:  Dizziness, Improper body mechanics, Pain, Decreased mobility, Postural dysfunction, Decreased activity tolerance, Decreased endurance, Decreased range of motion, Decreased strength, Hypomobility, Impaired UE functional use, Decreased balance, Impaired flexibility  Visit Diagnosis: Cervicalgia  Chronic midline low back pain, unspecified whether sciatica present  Muscle weakness (generalized)     Problem List Patient Active Problem List   Diagnosis Date Noted  . Diabetic polyneuropathy associated with type 2 diabetes mellitus ([place]) 07/17/2017  . Osteopenia 06/29/2016  . Encounter for Medicare annual wellness exam 06/25/2016  . Abdominal pain, LUQ 06/25/2016  . Ingrowing toenail 01/04/2016  . CKD (chronic kidney disease) stage 3, GFR 30-59 ml/min (HCC) 12/06/2015  . 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 Varicose veins of both lower  extremities with complications 93/23/5573  . Seasonal allergies 05/30/2015  . Poor vision 03/10/2015  . Onychomycosis 12/14/2014  . Vitamin D deficiency 10/17/2014  .  Orthostatic dizziness 10/15/2014  . Right hand pain 10/15/2014  . Numbness of fingers of both hands 10/15/2014  . Depression 09/07/2014  . Diabetic retinopathy ([place]) 11/24/2013  . DM (diabetes mellitus), type 2, uncontrolled ([place]) 03/19/2008  . Anemia 03/19/2008  . ARTHRITIS 03/19/2008  . OSTEOPOROSIS 03/19/2008    Brenda Andrews, PTA 07/11/2018, 9:19 AM  [facility] [address] [place], [place], [address] Phone: [phone]   Fax:  [phone]  Name: Brenda Andrews MRN: [id number] Date of Birth: 09-01-1945  Access Code: [email or web address]  URL: [email or web address]  Date: 07/11/2018  Prepared by: Myrene Galas   Exercises  Supine Lower Trunk Rotation - 3 reps - 2 sets - 20 hold - 2x daily - 7x weekly  Hooklying Single Knee to Chest - 2 reps - 2 sets - 20 hold - 2x daily - 7x weekly  Supine Piriformis Stretch with Foot on Ground - 2 reps - 2 sets - 20 hold - 2x daily - 7x weekly  Supine Cervical Rotation AROM on Pillow - 10 reps - 1 sets - 2x daily - 7x weekly  Seated Transversus Abdominis Bracing - 10 reps - 1 sets - 5 hold - 1x daily - 7x weekly  Supine Bridge - 10 reps - 1 sets - 3 hold - 1x daily - 7x weekly  Supine Straight Leg Raises - 10 reps - 1 sets - 1x daily - 7x weekly  Seated Cervical Sidebending AROM - 10 reps - 1 sets - 3x daily - 7x weekly

## 2018-07-16 ENCOUNTER — Ambulatory Visit: Payer: Medicare Other | Admitting: Physical Therapy

## 2018-07-16 ENCOUNTER — Encounter: Payer: Self-pay | Admitting: Physical Therapy

## 2018-07-16 DIAGNOSIS — M6281 Muscle weakness (generalized): Secondary | ICD-10-CM

## 2018-07-16 DIAGNOSIS — M545 Low back pain, unspecified: Secondary | ICD-10-CM

## 2018-07-16 DIAGNOSIS — M542 Cervicalgia: Secondary | ICD-10-CM | POA: Diagnosis not present

## 2018-07-16 DIAGNOSIS — G8929 Other chronic pain: Secondary | ICD-10-CM

## 2018-07-16 NOTE — Therapy (Signed)
[facility] [address], [place], [place], [address] Phone: [phone]   Fax:  [phone]  Physical Therapy Treatment  Patient Details  Name: Brenda Andrews MRN: [id number] Date of Birth: [birth year] Referring Provider (PT): Kendall Flack, MD   Encounter Date: 07/16/2018  PT End of Session - 07/16/18 0801    Visit Number  4    Date for PT Re-Evaluation  08/27/18    Authorization Type  Medicare/Medicaid    Authorization Time Period  07/02/18-08/27/18    PT Start Time  0800    PT Stop Time  0845    PT Time Calculation (min)  45 min    Activity Tolerance  Patient tolerated treatment well    Behavior During Therapy  [facility] for tasks assessed/performed       Past Medical History:  Diagnosis Date  . Arthritis   . Chronic kidney disease 2002  . COLITIS 03/19/2008   Qualifier: Diagnosis of  By: Marland Mcalpine    . Diabetes mellitus 1982  . Hyperlipidemia 2013  . IBS (irritable bowel syndrome)     History reviewed. No pertinent surgical history.  There were no vitals filed for this visit.  Subjective Assessment - 07/16/18 0805    Subjective  Doing well regarding her back, having trouble pulling exercises up on her tablet.    Currently in Pain?  No/denies    Multiple Pain Sites  No                       [facility] Adult PT Treatment/Exercise - 07/16/18 0001      Lumbar Exercises: Stretches   Single Knee to Chest Stretch  Right;Left;2 reps;10 seconds    Lower Trunk Rotation  --   rocking 10x, then 10 sec hold 2x bil   Other Lumbar Stretch Exercise  Seated hamstring stretch bil 2x 20 sec   added to HEP     Lumbar Exercises: Aerobic   Nustep  L2 x 10 min   PTA concurrent review of status and goals     Lumbar Exercises: Seated   Other Seated Lumbar Exercises  red band horizontal abd with postural emphasis 2x10, then diagonals 10x bil   red too hard, change to yellow with arms  lower: better   Other Seated Lumbar Exercises  Seated side bend and trunk rotation AROM bil 5x             PT Education - 07/16/18 0838    Education Details  Seated trunk rotation and sidebend AROM for HEP    Person(s) Educated  Patient    Methods  Explanation;Demonstration;Tactile cues;Verbal cues;Handout    Comprehension  Returned demonstration;Verbalized understanding       PT Short Term Goals - 07/16/18 4944      PT SHORT TERM GOAL #2   Title  Pt will be able to perform active daily tasks such as cooking or cleaning > or = 15 min before needing a break demonstrating greater endurance.    Time  5    Period  Weeks    Status  Achieved        PT Long Term Goals - 07/16/18 9675      PT LONG TERM GOAL #3   Title  Pt will be able to perform at least 30 min of active daily tasks before needing a break to increase household and community activity participation.    Time  8  Period  Weeks    Status  Achieved      PT LONG TERM GOAL #4   Title  Pt will report at least 50% improvement in neck and back pain.    Time  8    Period  Weeks    Status  Achieved   50%           Plan - 07/16/18 0803    Clinical Impression Statement  Pt still having trouble with her Medbridge on her tablet. We went over again with the intrepreter who downloaded the App for her. Pt is ridingher stationary bike at home 2x week, PTA suggested to try 3x week. Pt was able to participate in AROM exercises for sidebending and rotation which wil lbe added to her HEP today. We attempted postural strength with the red band. That resistance was too heavy for her RTUE. Yellow was better but still difficult with RTUE weakness. No pain at all: pre tx, during or post. Pt reports her pain is 50% decreased in both her neck and back. She can typically be active at home for 3 hours before needing a rest. This is not every day, but frequent enough. These goals are now met.     Rehab Potential  Good    Clinical  Impairments Affecting Rehab Potential  complex medical history, past Rt shoulder ORIF with limited ROM and strength, Pt reports malaise/fatigue, limited endurance    PT Frequency  2x / week    PT Duration  8 weeks    PT Treatment/Interventions  ADLs/Self Care Home Management;Cryotherapy;Electrical Stimulation;Moist Heat;Traction;Stair training;Gait training;Functional mobility training;Therapeutic activities;Therapeutic exercise;Balance training;Neuromuscular re-education;Patient/family education;Manual techniques;Passive range of motion;Dry needling;Taping;Spinal Manipulations;Joint Manipulations    PT Next Visit Plan  Review HEP, begin gentle strength to LE, core and postural muscles. Do steps.    PT Home Exercise Plan  [email or web address]    Consulted and Agree with Plan of Care  Patient       Patient will benefit from skilled therapeutic intervention in order to improve the following deficits and impairments:  Dizziness, Improper body mechanics, Pain, Decreased mobility, Postural dysfunction, Decreased activity tolerance, Decreased endurance, Decreased range of motion, Decreased strength, Hypomobility, Impaired UE functional use, Decreased balance, Impaired flexibility  Visit Diagnosis: Cervicalgia  Chronic midline low back pain, unspecified whether sciatica present  Muscle weakness (generalized)     Problem List Patient Active Problem List   Diagnosis Date Noted  . Diabetic polyneuropathy associated with type 2 diabetes mellitus ([place]) 07/17/2017  . Osteopenia 06/29/2016  . Encounter for Medicare annual wellness exam 06/25/2016  . Abdominal pain, LUQ 06/25/2016  . Ingrowing toenail 01/04/2016  . CKD (chronic kidney disease) stage 3, GFR 30-59 ml/min (HCC) 12/06/2015  . 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 Varicose veins of both lower extremities with complications 28/41/3244  . Seasonal allergies 05/30/2015  . Poor vision 03/10/2015  . Onychomycosis 12/14/2014  .  Vitamin D deficiency 10/17/2014  . Orthostatic dizziness 10/15/2014  . Right hand pain 10/15/2014  . Numbness of fingers of both hands 10/15/2014  . Depression 09/07/2014  . Diabetic retinopathy ([place]) 11/24/2013  . DM (diabetes mellitus), type 2, uncontrolled ([place]) 03/19/2008  . Anemia 03/19/2008  . ARTHRITIS 03/19/2008  . OSTEOPOROSIS 03/19/2008    COCHRAN,JENNIFER, PTA 07/16/2018, 8:48 AM  [facility] [address], [place], [place], [address] Phone: [phone]   Fax:  [phone]  Name: Brenda Andrews MRN: [id number] Date of Birth: 14-May-1946

## 2018-07-17 DIAGNOSIS — E113523 Type 2 diabetes mellitus with proliferative diabetic retinopathy with traction retinal detachment involving the macula, bilateral: Secondary | ICD-10-CM | POA: Diagnosis not present

## 2018-07-17 DIAGNOSIS — Z961 Presence of intraocular lens: Secondary | ICD-10-CM | POA: Diagnosis not present

## 2018-07-18 ENCOUNTER — Encounter: Payer: Self-pay | Admitting: Physical Therapy

## 2018-07-18 ENCOUNTER — Ambulatory Visit: Payer: Medicare Other | Admitting: Physical Therapy

## 2018-07-18 DIAGNOSIS — G8929 Other chronic pain: Secondary | ICD-10-CM | POA: Diagnosis not present

## 2018-07-18 DIAGNOSIS — M545 Low back pain, unspecified: Secondary | ICD-10-CM

## 2018-07-18 DIAGNOSIS — M542 Cervicalgia: Secondary | ICD-10-CM

## 2018-07-18 DIAGNOSIS — M6281 Muscle weakness (generalized): Secondary | ICD-10-CM

## 2018-07-18 NOTE — Therapy (Signed)
[facility] [address], [place], [place], [address] Phone: [phone]   Fax:  [phone]  Physical Therapy Treatment  Patient Details  Name: Brenda Andrews MRN: [id number] Date of Birth: 09/05/1945 Referring Provider (PT): Kendall Flack, MD   Encounter Date: 07/18/2018  PT End of Session - 07/18/18 0929    Visit Number  5    Date for PT Re-Evaluation  08/27/18    Authorization Type  Medicare/Medicaid    Authorization Time Period  07/02/18-08/27/18    PT Start Time  0930    PT Stop Time  1010    PT Time Calculation (min)  40 min    Activity Tolerance  Patient tolerated treatment well    Behavior During Therapy  [facility] for tasks assessed/performed       Past Medical History:  Diagnosis Date  . Arthritis   . Chronic kidney disease 2002  . COLITIS 03/19/2008   Qualifier: Diagnosis of  By: Marland Mcalpine    . Diabetes mellitus 1982  . Hyperlipidemia 2013  . IBS (irritable bowel syndrome)     History reviewed. No pertinent surgical history.  There were no vitals filed for this visit.  Subjective Assessment - 07/18/18 0934    Subjective  Continues to report she is doing well, no pain right now.     Currently in Pain?  No/denies    Multiple Pain Sites  No                       [facility] Adult PT Treatment/Exercise - 07/18/18 0001      Lumbar Exercises: Stretches   Single Knee to Chest Stretch  Right;Left;2 reps;10 seconds    Lower Trunk Rotation  --   rocking 10x, then 10 sec hold 2x bil   Other Lumbar Stretch Exercise  Seated hamstring stretch bil 2x 20 sec   added to HEP     Lumbar Exercises: Aerobic   Nustep  L2 x 10 min   PTA concurrent review of status     Lumbar Exercises: Standing   Heel Raises  10 reps    Other Standing Lumbar Exercises  Hip abduction 15x bil 0#   VC/Tc to correct technique     Lumbar Exercises: Seated   Other Seated Lumbar Exercises  LAQ  2# 10x bil   red band clamshells 2x 15   Other Seated Lumbar Exercises  Seated side bend RT and trunk rotation AROM bil 10x               PT Short Term Goals - 07/16/18 9379      PT SHORT TERM GOAL #2   Title  Pt will be able to perform active daily tasks such as cooking or cleaning > or = 15 min before needing a break demonstrating greater endurance.    Time  5    Period  Weeks    Status  Achieved        PT Long Term Goals - 07/16/18 0240      PT LONG TERM GOAL #3   Title  Pt will be able to perform at least 30 min of active daily tasks before needing a break to increase household and community activity participation.    Time  8    Period  Weeks    Status  Achieved      PT LONG TERM GOAL #4   Title  Pt will report at least  50% improvement in neck and back pain.    Time  8    Period  Weeks    Status  Achieved   50%           Plan - 07/18/18 1011    Clinical Impression Statement  Pt presents with no pain today. She had some this morning getting in/out of her shower/tub but it better now. Added new exercises to session today: LE resistive exercises for strength, standing LE strength and she was able to tolerate more AROM for her trunk.      Rehab Potential  Good    Clinical Impairments Affecting Rehab Potential  complex medical history, past Rt shoulder ORIF with limited ROM and strength, Pt reports malaise/fatigue, limited endurance    PT Frequency  2x / week    PT Duration  8 weeks    PT Treatment/Interventions  ADLs/Self Care Home Management;Cryotherapy;Electrical Stimulation;Moist Heat;Traction;Stair training;Gait training;Functional mobility training;Therapeutic activities;Therapeutic exercise;Balance training;Neuromuscular re-education;Patient/family education;Manual techniques;Passive range of motion;Dry needling;Taping;Spinal Manipulations;Joint Manipulations    PT Next Visit Plan  LE strength, core/postural muscle strength. Do steps next sesison    PT  Home Exercise Plan  [id number]    Consulted and Agree with Plan of Care  Patient       Patient will benefit from skilled therapeutic intervention in order to improve the following deficits and impairments:  Dizziness, Improper body mechanics, Pain, Decreased mobility, Postural dysfunction, Decreased activity tolerance, Decreased endurance, Decreased range of motion, Decreased strength, Hypomobility, Impaired UE functional use, Decreased balance, Impaired flexibility  Visit Diagnosis: Cervicalgia  Chronic midline low back pain, unspecified whether sciatica present  Muscle weakness (generalized)     Problem List Patient Active Problem List   Diagnosis Date Noted  . Diabetic polyneuropathy associated with type 2 diabetes mellitus ([place]) 07/17/2017  . Osteopenia 06/29/2016  . Encounter for Medicare annual wellness exam 06/25/2016  . Abdominal pain, LUQ 06/25/2016  . Ingrowing toenail 01/04/2016  . CKD (chronic kidney disease) stage 3, GFR 30-59 ml/min (HCC) 12/06/2015  . 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 Varicose veins of both lower extremities with complications 60/11/5995  . Seasonal allergies 05/30/2015  . Poor vision 03/10/2015  . Onychomycosis 12/14/2014  . Vitamin D deficiency 10/17/2014  . Orthostatic dizziness 10/15/2014  . Right hand pain 10/15/2014  . Numbness of fingers of both hands 10/15/2014  . Depression 09/07/2014  . Diabetic retinopathy ([place]) 11/24/2013  . DM (diabetes mellitus), type 2, uncontrolled ([place]) 03/19/2008  . Anemia 03/19/2008  . ARTHRITIS 03/19/2008  . OSTEOPOROSIS 03/19/2008    Chidubem Chaires, PTA 07/18/2018, 10:14 AM  [facility] [address], [place], [place], [address] Phone: [phone]   Fax:  [phone]  Name: Sherece Gambrill MRN: [id number] Date of Birth: Jan 08, 1946

## 2018-07-23 ENCOUNTER — Ambulatory Visit: Payer: Medicare Other | Admitting: Physical Therapy

## 2018-07-23 ENCOUNTER — Encounter: Payer: Self-pay | Admitting: Physical Therapy

## 2018-07-23 DIAGNOSIS — M545 Low back pain, unspecified: Secondary | ICD-10-CM

## 2018-07-23 DIAGNOSIS — G8929 Other chronic pain: Secondary | ICD-10-CM

## 2018-07-23 DIAGNOSIS — M542 Cervicalgia: Secondary | ICD-10-CM | POA: Diagnosis not present

## 2018-07-23 DIAGNOSIS — M6281 Muscle weakness (generalized): Secondary | ICD-10-CM

## 2018-07-23 NOTE — Therapy (Signed)
[facility] [address], [place], [place], [address] Phone: [phone]   Fax:  [phone]  Physical Therapy Treatment  Patient Details  Name: Brenda Andrews MRN: [id number] Date of Birth: Jan 06, 1946 Referring Provider (PT): Kendall Flack, MD   Encounter Date: 07/23/2018  PT End of Session - 07/23/18 0852    Visit Number  6    Date for PT Re-Evaluation  08/27/18    Authorization Type  Medicare/Medicaid    Authorization Time Period  07/02/18-08/27/18    PT Start Time  0840    PT Stop Time  0918    PT Time Calculation (min)  38 min    Activity Tolerance  Patient tolerated treatment well    Behavior During Therapy  [facility] for tasks assessed/performed       Past Medical History:  Diagnosis Date  . Arthritis   . Chronic kidney disease 2002  . COLITIS 03/19/2008   Qualifier: Diagnosis of  By: Marland Mcalpine    . Diabetes mellitus 1982  . Hyperlipidemia 2013  . IBS (irritable bowel syndrome)     History reviewed. No pertinent surgical history.  There were no vitals filed for this visit.  Subjective Assessment - 07/23/18 0854    Subjective  No new complaints, doing well. I have not done my exercises at home this week.     Patient is accompained by:  Interpreter    Limitations  Sitting;Lifting;Walking;House hold activities    Currently in Pain?  No/denies    Multiple Pain Sites  No                       [facility] Adult PT Treatment/Exercise - 07/23/18 0001      Lumbar Exercises: Stretches   Single Knee to Chest Stretch  Right;Left;2 reps;10 seconds    Lower Trunk Rotation  --   rocking 10x, then 10 sec hold 2x bil   Other Lumbar Stretch Exercise  Seated hamstring stretch bil 2x 20 sec   added to HEP     Lumbar Exercises: Aerobic   Nustep  L2 x 10 min   PTA concurrent review of status     Lumbar Exercises: Standing   Heel Raises  20 reps    Other Standing Lumbar  Exercises  Hip abduction 15x bil 2#   VC/Tc to correct technique     Lumbar Exercises: Seated   Other Seated Lumbar Exercises  LAQ 2# 15x bil   red band clamshells 2x 15   Other Seated Lumbar Exercises  hip ER red 20x red  band      Knee/Hip Exercises: Standing   Knee Flexion  Strengthening;Both;1 set;10 reps   2#   Hip Extension  Stengthening;Both;1 set;10 reps;Knee straight   2#   Stairs  4 steps, 1 rail 2x. reciprocal , no pain               PT Short Term Goals - 07/16/18 9470      PT SHORT TERM GOAL #2   Title  Pt will be able to perform active daily tasks such as cooking or cleaning > or = 15 min before needing a break demonstrating greater endurance.    Time  5    Period  Weeks    Status  Achieved        PT Long Term Goals - 07/23/18 0917      PT LONG TERM GOAL #5   Title  Pt will be able to perform 10 step ups and 10 step downs with single UE support safely to simulate her flight of stairs at home.    Time  8    Period  Weeks    Status  On-going            Plan - 07/23/18 5732    Clinical Impression Statement  Pt reports she is going up and down her stairs multiple times during the day. Last 24 hrs she describes feeling like she is coming down with something so she has not done her HEP and she admitts to forgetting. She discussed putting her papers close by where she will see them to remind her. She agreed to try that. Added light weights to her standing LE strengtheing exercises. She tolerated them without any pain, but has difficulty performing hip abd and extension correctly. Appears a combination of poor coordination/ awareness of how to activate and weakness of the hips.     Rehab Potential  Good    Clinical Impairments Affecting Rehab Potential  complex medical history, past Rt shoulder ORIF with limited ROM and strength, Pt reports malaise/fatigue, limited endurance    PT Frequency  2x / week    PT Duration  8 weeks    PT Treatment/Interventions   ADLs/Self Care Home Management;Cryotherapy;Electrical Stimulation;Moist Heat;Traction;Stair training;Gait training;Functional mobility training;Therapeutic activities;Therapeutic exercise;Balance training;Neuromuscular re-education;Patient/family education;Manual techniques;Passive range of motion;Dry needling;Taping;Spinal Manipulations;Joint Manipulations    PT Next Visit Plan  LE strength, core/postural muscle strength. MMT LE per goal innext 1-2 sessions.     PT Home Exercise Plan  [email or web address]    Consulted and Agree with Plan of Care  Patient       Patient will benefit from skilled therapeutic intervention in order to improve the following deficits and impairments:  Dizziness, Improper body mechanics, Pain, Decreased mobility, Postural dysfunction, Decreased activity tolerance, Decreased endurance, Decreased range of motion, Decreased strength, Hypomobility, Impaired UE functional use, Decreased balance, Impaired flexibility  Visit Diagnosis: Cervicalgia  Chronic midline low back pain, unspecified whether sciatica present  Muscle weakness (generalized)     Problem List Patient Active Problem List   Diagnosis Date Noted  . Diabetic polyneuropathy associated with type 2 diabetes mellitus ([place]) 07/17/2017  . Osteopenia 06/29/2016  . Encounter for Medicare annual wellness exam 06/25/2016  . Abdominal pain, LUQ 06/25/2016  . Ingrowing toenail 01/04/2016  . CKD (chronic kidney disease) stage 3, GFR 30-59 ml/min (HCC) 12/06/2015  . 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 Varicose veins of both lower extremities with complications 20/25/4270  . Seasonal allergies 05/30/2015  . Poor vision 03/10/2015  . Onychomycosis 12/14/2014  . Vitamin D deficiency 10/17/2014  . Orthostatic dizziness 10/15/2014  . Right hand pain 10/15/2014  . Numbness of fingers of both hands 10/15/2014  . Depression 09/07/2014  . Diabetic retinopathy ([place]) 11/24/2013  . DM (diabetes mellitus),  type 2, uncontrolled ([place]) 03/19/2008  . Anemia 03/19/2008  . ARTHRITIS 03/19/2008  . OSTEOPOROSIS 03/19/2008    Madinah Quarry, PTA 07/23/2018, 9:22 AM   [facility] [address] [place], [place], [address] Phone: [phone]   Fax:  [phone]  Name: Siah Kannan MRN: [id number] Date of Birth: 12-24-1945

## 2018-07-30 ENCOUNTER — Ambulatory Visit: Payer: Medicare Other | Attending: Family Medicine | Admitting: Physical Therapy

## 2018-07-30 ENCOUNTER — Encounter: Payer: Self-pay | Admitting: Physical Therapy

## 2018-07-30 DIAGNOSIS — M6281 Muscle weakness (generalized): Secondary | ICD-10-CM

## 2018-07-30 DIAGNOSIS — G8929 Other chronic pain: Secondary | ICD-10-CM | POA: Diagnosis present

## 2018-07-30 DIAGNOSIS — M545 Low back pain, unspecified: Secondary | ICD-10-CM

## 2018-07-30 DIAGNOSIS — M542 Cervicalgia: Secondary | ICD-10-CM | POA: Diagnosis not present

## 2018-07-30 NOTE — Therapy (Signed)
[facility] [address] [place], [place], [address] Phone: [phone]   Fax:  [phone]  Physical Therapy Treatment  Patient Details  Name: Brenda Andrews MRN: [id number] Date of Birth: [birth year] Referring Provider (PT): Kendall Flack, MD   Encounter Date: 07/30/2018  PT End of Session - 07/30/18 0841    Visit Number  7    Date for PT Re-Evaluation  08/27/18    Authorization Type  Medicare/Medicaid    Authorization Time Period  07/02/18-08/27/18    PT Start Time  0840    PT Stop Time  0918    PT Time Calculation (min)  38 min    Activity Tolerance  Patient tolerated treatment well    Behavior During Therapy  [facility] for tasks assessed/performed       Past Medical History:  Diagnosis Date  . Arthritis   . Chronic kidney disease 2002  . COLITIS 03/19/2008   Qualifier: Diagnosis of  By: Marland Mcalpine    . Diabetes mellitus 1982  . Hyperlipidemia 2013  . IBS (irritable bowel syndrome)     History reviewed. No pertinent surgical history.  There were no vitals filed for this visit.  Subjective Assessment - 07/30/18 0842    Subjective  My eyes are burning this AM. I have a little back pain.    Patient is accompained by:  Interpreter    Currently in Pain?  Yes    Pain Score  3     Pain Location  Back    Pain Orientation  Lower    Pain Descriptors / Indicators  Sore;Dull    Multiple Pain Sites  No         [facility] PT Assessment - 07/30/18 0001      Strength   Overall Strength Comments  RT quad 4+/5, LT 4/5: Bil hip flexors 4/5: Bil hip flexors 4-4+/5                   [facility] Adult PT Treatment/Exercise - 07/30/18 0001      Lumbar Exercises: Stretches   Single Knee to Chest Stretch  Right;Left;2 reps;10 seconds    Lower Trunk Rotation  --   rocking 10x, then 10 sec hold 2x bil     Lumbar Exercises: Aerobic   Nustep  L2 x 10 min   PTA concurrent review of status     Lumbar Exercises: Standing   Other Standing Lumbar Exercises  Hip abduction 15x bil 2#   VC/Tc to correct technique: 2 sets     Lumbar Exercises: Seated   Other Seated Lumbar Exercises  LAQ 3# 10x bil    Other Seated Lumbar Exercises  hip ER red 20x red  band   Give pt a foot stool     Lumbar Exercises: Supine   Bridge  10 reps      Knee/Hip Exercises: Standing   Stairs  Step ups 6 inch box 10x2  bil                PT Short Term Goals - 07/30/18 0855      PT SHORT TERM GOAL #3   Title  Pt will receive at least a 4-/5 in bil UEs and LEs so that she will be able to perform more daily tasks with less pain.    Time  5    Period  Weeks    Status  Partially Met   Met for LE today  PT Long Term Goals - 07/23/18 0917      PT LONG TERM GOAL #5   Title  Pt will be able to perform 10 step ups and 10 step downs with single UE support safely to simulate her flight of stairs at home.    Time  8    Period  Weeks    Status  On-going            Plan - 07/30/18 0841    Clinical Impression Statement  pt reports not doing much around the house because of the hoildays. She presents with red eyes that she says burns. This did not seem to bother her throughout the sesison. MMT shows increased LE strength, partially meeting this long term goal. Pt demonstrated  improved technique with standing hip abduction, although she does require min verbal cues and some demonstation still to get exercise going.     Rehab Potential  Good    Clinical Impairments Affecting Rehab Potential  complex medical history, past Rt shoulder ORIF with limited ROM and strength, Pt reports malaise/fatigue, limited endurance    PT Frequency  2x / week    PT Duration  8 weeks    PT Treatment/Interventions  ADLs/Self Care Home Management;Cryotherapy;Electrical Stimulation;Moist Heat;Traction;Stair training;Gait training;Functional mobility training;Therapeutic activities;Therapeutic exercise;Balance  training;Neuromuscular re-education;Patient/family education;Manual techniques;Passive range of motion;Dry needling;Taping;Spinal Manipulations;Joint Manipulations    PT Next Visit Plan  LE strength, core/postural muscle strength. MMT UE per goal in next 1-2 sessions.     PT Home Exercise Plan  [email or web address]    Consulted and Agree with Plan of Care  Patient       Patient will benefit from skilled therapeutic intervention in order to improve the following deficits and impairments:  Dizziness, Improper body mechanics, Pain, Decreased mobility, Postural dysfunction, Decreased activity tolerance, Decreased endurance, Decreased range of motion, Decreased strength, Hypomobility, Impaired UE functional use, Decreased balance, Impaired flexibility  Visit Diagnosis: Cervicalgia  Chronic midline low back pain, unspecified whether sciatica present  Muscle weakness (generalized)     Problem List Patient Active Problem List   Diagnosis Date Noted  . Diabetic polyneuropathy associated with type 2 diabetes mellitus ([place]) 07/17/2017  . Osteopenia 06/29/2016  . Encounter for Medicare annual wellness exam 06/25/2016  . Abdominal pain, LUQ 06/25/2016  . Ingrowing toenail 01/04/2016  . CKD (chronic kidney disease) stage 3, GFR 30-59 ml/min (HCC) 12/06/2015  . 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 Varicose veins of both lower extremities with complications 93/26/7124  . Seasonal allergies 05/30/2015  . Poor vision 03/10/2015  . Onychomycosis 12/14/2014  . Vitamin D deficiency 10/17/2014  . Orthostatic dizziness 10/15/2014  . Right hand pain 10/15/2014  . Numbness of fingers of both hands 10/15/2014  . Depression 09/07/2014  . Diabetic retinopathy ([place]) 11/24/2013  . DM (diabetes mellitus), type 2, uncontrolled ([place]) 03/19/2008  . Anemia 03/19/2008  . ARTHRITIS 03/19/2008  . OSTEOPOROSIS 03/19/2008    COCHRAN,JENNIFER, PTA 07/30/2018, 9:19 AM  [facility] [address] [place], [place], [address] Phone: [phone]   Fax:  [phone]  Name: Kirk Sampley MRN: [id number] Date of Birth: [birth year]

## 2018-08-01 ENCOUNTER — Ambulatory Visit: Payer: Medicare Other | Admitting: Physical Therapy

## 2018-08-01 ENCOUNTER — Encounter: Payer: Self-pay | Admitting: Physical Therapy

## 2018-08-01 DIAGNOSIS — M545 Low back pain: Secondary | ICD-10-CM

## 2018-08-01 DIAGNOSIS — G8929 Other chronic pain: Secondary | ICD-10-CM

## 2018-08-01 DIAGNOSIS — M6281 Muscle weakness (generalized): Secondary | ICD-10-CM

## 2018-08-01 DIAGNOSIS — M542 Cervicalgia: Secondary | ICD-10-CM

## 2018-08-01 NOTE — Therapy (Addendum)
[facility] [address], [place], [place], [address] Phone: [phone]   Fax:  [phone]  Physical Therapy Treatment  Patient Details  Name: Brenda Andrews MRN: [id number] Date of Birth: [birth year] Referring Provider (PT): Kendall Flack, MD   Encounter Date: 08/01/2018  PT End of Session - 08/01/18 0842    Visit Number  8    Date for PT Re-Evaluation  08/27/18    Authorization Type  Medicare/Medicaid    Authorization Time Period  07/02/18-08/27/18    PT Start Time  0842    PT Stop Time  0926    PT Time Calculation (min)  44 min    Activity Tolerance  Patient tolerated treatment well    Behavior During Therapy  [facility] for tasks assessed/performed       Past Medical History:  Diagnosis Date  . Arthritis   . Chronic kidney disease 2002  . COLITIS 03/19/2008   Qualifier: Diagnosis of  By: Marland Mcalpine    . Diabetes mellitus 1982  . Hyperlipidemia 2013  . IBS (irritable bowel syndrome)     History reviewed. No pertinent surgical history.  There were no vitals filed for this visit.  Subjective Assessment - 08/01/18 0847    Subjective  I felt my legs after last time.     Currently in Pain?  No/denies    Multiple Pain Sites  No                       [facility] Adult PT Treatment/Exercise - 08/01/18 0001      Lumbar Exercises: Stretches   Single Knee to Chest Stretch  Right;Left;2 reps;10 seconds    Lower Trunk Rotation  --   Knee to opposite shoulder for hip stretch: bil 20 sec 2x   Other Lumbar Stretch Exercise  Seated hamstring stretch bil 2x 20 sec   added to HEP     Lumbar Exercises: Aerobic   Nustep  L2 x 10 min   PTA concurrent review of status     Lumbar Exercises: Standing   Heel Raises  20 reps    Other Standing Lumbar Exercises  Hip abduction 20x bil 2#   Hip extension bil 2# 2x10      Lumbar Exercises: Seated   Other Seated Lumbar Exercises  LAQ 3# Bil  20x    Other Seated Lumbar Exercises  hip ER red 25x red band   Give pt a foot stool     Lumbar Exercises: Supine   Bridge  10 reps      Knee/Hip Exercises: Standing   Stairs  Step ups 6 inch box 15x2  bil    VC to use LE > UE            PT Education - 08/01/18 0911    Education Details  Issued red band for pt to do seated hip clamshells at home    Person(s) Educated  Patient    Methods  Explanation;Demonstration   Gave red band   Comprehension  Returned demonstration       PT Short Term Goals - 07/30/18 0855      PT SHORT TERM GOAL #3   Title  Pt will receive at least a 4-/5 in bil UEs and LEs so that she will be able to perform more daily tasks with less pain.    Time  5    Period  Weeks    Status  Partially  Met   Met for LE today       PT Long Term Goals - 07/23/18 0917      PT LONG TERM GOAL #5   Title  Pt will be able to perform 10 step ups and 10 step downs with single UE support safely to simulate her flight of stairs at home.    Time  8    Period  Weeks    Status  On-going            Plan - 08/01/18 0853    Clinical Impression Statement  Pt states she is doing much better. She goes up and down the stairs often throughout the day and on certain days she can do ADLS for hours without pain. She is trying to ride her bike at home 20 min 3x week. Pt does demonstrate her standing hip abduction with much improved technique, can hold her plane better.      Rehab Potential  Good    Clinical Impairments Affecting Rehab Potential  complex medical history, past Rt shoulder ORIF with limited ROM and strength, Pt reports malaise/fatigue, limited endurance    PT Frequency  2x / week    PT Duration  8 weeks    PT Treatment/Interventions  ADLs/Self Care Home Management;Cryotherapy;Electrical Stimulation;Moist Heat;Traction;Stair training;Gait training;Functional mobility training;Therapeutic activities;Therapeutic exercise;Balance training;Neuromuscular  re-education;Patient/family education;Manual techniques;Passive range of motion;Dry needling;Taping;Spinal Manipulations;Joint Manipulations    PT Next Visit Plan  Consider early DC, pt is doing very well she might be ok to continue with her HEP at home. UE MMT per goals. Give hip abduction for HEP.    PT Home Exercise Plan  [id number]    Consulted and Agree with Plan of Care  Patient       Patient will benefit from skilled therapeutic intervention in order to improve the following deficits and impairments:  Dizziness, Improper body mechanics, Pain, Decreased mobility, Postural dysfunction, Decreased activity tolerance, Decreased endurance, Decreased range of motion, Decreased strength, Hypomobility, Impaired UE functional use, Decreased balance, Impaired flexibility  Visit Diagnosis: Cervicalgia  Chronic midline low back pain, unspecified whether sciatica present  Muscle weakness (generalized)     Problem List Patient Active Problem List   Diagnosis Date Noted  . Diabetic polyneuropathy associated with type 2 diabetes mellitus ([place]) 07/17/2017  . Osteopenia 06/29/2016  . Encounter for Medicare annual wellness exam 06/25/2016  . Abdominal pain, LUQ 06/25/2016  . Ingrowing toenail 01/04/2016  . CKD (chronic kidney disease) stage 3, GFR 30-59 ml/min (HCC) 12/06/2015  . 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 Varicose veins of both lower extremities with complications 07/62/2633  . Seasonal allergies 05/30/2015  . Poor vision 03/10/2015  . Onychomycosis 12/14/2014  . Vitamin D deficiency 10/17/2014  . Orthostatic dizziness 10/15/2014  . Right hand pain 10/15/2014  . Numbness of fingers of both hands 10/15/2014  . Depression 09/07/2014  . Diabetic retinopathy ([place]) 11/24/2013  . DM (diabetes mellitus), type 2, uncontrolled ([place]) 03/19/2008  . Anemia 03/19/2008  . ARTHRITIS 03/19/2008  . OSTEOPOROSIS 03/19/2008    Lariza Cothron, PTA 08/01/2018, 9:34  AM  [facility] [address], [place], [place], [address] Phone: [phone]   Fax:  [phone]  Name: Brenda Andrews MRN: [id number] Date of Birth: [birth year]

## 2018-08-06 ENCOUNTER — Encounter: Payer: Self-pay | Admitting: Physical Therapy

## 2018-08-06 ENCOUNTER — Ambulatory Visit: Payer: Medicare Other | Admitting: Physical Therapy

## 2018-08-06 DIAGNOSIS — M545 Low back pain, unspecified: Secondary | ICD-10-CM

## 2018-08-06 DIAGNOSIS — G8929 Other chronic pain: Secondary | ICD-10-CM

## 2018-08-06 DIAGNOSIS — M6281 Muscle weakness (generalized): Secondary | ICD-10-CM

## 2018-08-06 DIAGNOSIS — M542 Cervicalgia: Secondary | ICD-10-CM | POA: Diagnosis not present

## 2018-08-06 NOTE — Therapy (Signed)
[facility] [address] [place], [place], [address] Phone: [phone]   Fax:  [phone]  Physical Therapy Treatment  Patient Details  Name: Brenda Andrews MRN: [id number] Date of Birth: 08-31-1945 Referring Provider (PT): Kendall Flack, MD   Encounter Date: 08/06/2018  PT End of Session - 08/06/18 0847    Visit Number  9    Date for PT Re-Evaluation  08/27/18    Authorization Type  Medicare/Medicaid    Authorization Time Period  07/02/18-08/27/18    PT Start Time  0801    PT Stop Time  0842    PT Time Calculation (min)  41 min    Activity Tolerance  Patient tolerated treatment well    Behavior During Therapy  [facility] for tasks assessed/performed       Past Medical History:  Diagnosis Date  . Arthritis   . Chronic kidney disease 2002  . COLITIS 03/19/2008   Qualifier: Diagnosis of  By: Marland Mcalpine    . Diabetes mellitus 1982  . Hyperlipidemia 2013  . IBS (irritable bowel syndrome)     History reviewed. No pertinent surgical history.  There were no vitals filed for this visit.  Subjective Assessment - 08/06/18 0805    Subjective  My neck doesn't hurt anymore.  I had back pain yesterday but I did the exercises and it helped the pain.  She states her back pain is 30-40% better since starting PT.  Pt     Patient is accompained by:  Interpreter    Limitations  Sitting;Lifting;Walking;House hold activities    How long can you sit comfortably?  1 hour    Diagnostic tests  xrays lumbar and cervical: multilevel degeneration and arthritis    Patient Stated Goals  tolerate activities longer before needing to lay down, feel more energy    Currently in Pain?  Yes    Pain Score  3     Pain Location  Back    Pain Orientation  Lower    Pain Descriptors / Indicators  Dull;Sore    Pain Type  Chronic pain    Pain Onset  More than a month ago    Pain Frequency  Intermittent                        [facility] Adult PT Treatment/Exercise - 08/06/18 0001      Exercises   Exercises  Lumbar;Knee/Hip      Lumbar Exercises: Stretches   Active Hamstring Stretch  Right;Left;30 seconds;2 reps    Single Knee to Chest Stretch  Right;Left;2 reps;10 seconds    Piriformis Stretch  Right;Left;30 seconds;1 rep   with strap in supine   Other Lumbar Stretch Exercise  Seated trunk rotations x 5 Rt/Lt, seated SB stretch 1x30 sec Rt/Lt      Lumbar Exercises: Aerobic   Nustep  L2 x 10 min   PT present to discuss progress     Knee/Hip Exercises: Standing   Hip Abduction  Stengthening;10 reps;Both;Knee straight    Abduction Limitations  2#   PT cued Pt to stand tall, engage core, avoid hip ER   Hip Extension  Stengthening;Both;10 reps;Knee straight    Extension Limitations  2#    Other Standing Knee Exercises  4 stair flight up/down x 5 reps, UE contact bil on rails   PT cued Pt to use UEs for contact balance and no pulling     Knee/Hip Exercises: Seated  Long Arc [employer]  Strengthening;Both;15 reps;Weights;2 sets    [employer] Weight  3 lbs.    Long [employer] Limitations  PT cued Pt to sit tall and engage core    Clamshell with TheraBand  Red   1x15     Knee/Hip Exercises: Supine   Bridges with Clamshell  Strengthening;Both;20 reps   PT cued sequencing for abduct during bridging              PT Short Term Goals - 07/30/18 0855      PT SHORT TERM GOAL #3   Title  Pt will receive at least a 4-/5 in bil UEs and LEs so that she will be able to perform more daily tasks with less pain.    Time  5    Period  Weeks    Status  Partially Met   Met for LE today       PT Long Term Goals - 08/06/18 0849      PT LONG TERM GOAL #1   Title  Pt will be ind in advanced HEP    Time  8    Period  Weeks    Status  On-going      PT LONG TERM GOAL #5   Title  Pt will be able to perform 10 step ups and 10 step downs with single UE support safely to simulate  her flight of stairs at home.    Time  8    Period  Weeks    Status  On-going            Plan - 08/06/18 0847    Clinical Impression Statement  Pt reports full resolution of neck pain which was her primary pain location at initial evaluation.  She reports 30-40% improvement in LBP since starting PT.  She states she still gets fatigued throughout the day.  Pt states her HEP helps her back pain when it hurts and exercise does bring her more energy.  She is working on increasing her time on her stationary bike at home and is currently doing 10 min 2x/week.  PT encouraged her to add a minute each exercise session to work up to 20 min over the next several weeks and aim for 3x/week.  Pt demonstrated safe performance of stairs today with step over step pattern with minimal contact use of UEs bil.  PT needed to cue Pt on proper hip abduction to avoid ER and cued Pt to stand tall and engage core.  She will continue to benefit from skilled PT to progress strengthening, endurance and postural stability to increase her tolerance of daily demands.    Rehab Potential  Good    Clinical Impairments Affecting Rehab Potential  complex medical history, past Rt shoulder ORIF with limited ROM and strength, Pt reports malaise/fatigue, limited endurance    PT Frequency  2x / week    PT Duration  8 weeks    PT Treatment/Interventions  ADLs/Self Care Home Management;Cryotherapy;Electrical Stimulation;Moist Heat;Traction;Stair training;Gait training;Functional mobility training;Therapeutic activities;Therapeutic exercise;Balance training;Neuromuscular re-education;Patient/family education;Manual techniques;Passive range of motion;Dry needling;Taping;Spinal Manipulations;Joint Manipulations    PT Next Visit Plan  Pt would like to complete initial cert of PT to further understand exercises, add UE tband rows, review horiz abduction    PT [place] and Agree with Plan of Care  Patient        Patient will benefit from skilled therapeutic intervention in order to improve the  following deficits and impairments:  Dizziness, Improper body mechanics, Pain, Decreased mobility, Postural dysfunction, Decreased activity tolerance, Decreased endurance, Decreased range of motion, Decreased strength, Hypomobility, Impaired UE functional use, Decreased balance, Impaired flexibility  Visit Diagnosis: Chronic midline low back pain, unspecified whether sciatica present  Muscle weakness (generalized)     Problem List Patient Active Problem List   Diagnosis Date Noted  . Diabetic polyneuropathy associated with type 2 diabetes mellitus ([place]) 07/17/2017  . Osteopenia 06/29/2016  . Encounter for Medicare annual wellness exam 06/25/2016  . Abdominal pain, LUQ 06/25/2016  . Ingrowing toenail 01/04/2016  . CKD (chronic kidney disease) stage 3, GFR 30-59 ml/min (HCC) 12/06/2015  . 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 Varicose veins of both lower extremities with complications 43/73/5789  . Seasonal allergies 05/30/2015  . Poor vision 03/10/2015  . Onychomycosis 12/14/2014  . Vitamin D deficiency 10/17/2014  . Orthostatic dizziness 10/15/2014  . Right hand pain 10/15/2014  . Numbness of fingers of both hands 10/15/2014  . Depression 09/07/2014  . Diabetic retinopathy ([place]) 11/24/2013  . DM (diabetes mellitus), type 2, uncontrolled ([place]) 03/19/2008  . Anemia 03/19/2008  . ARTHRITIS 03/19/2008  . OSTEOPOROSIS 03/19/2008   Baruch Merl, PT 08/06/18 8:51 AM    [facility] [address], [place], [place], [address] Phone: [phone]   Fax:  [phone]  Name: Brenda Andrews MRN: [id number] Date of Birth: 1946-02-16

## 2018-08-08 ENCOUNTER — Ambulatory Visit: Payer: Medicare Other | Admitting: Physical Therapy

## 2018-08-13 ENCOUNTER — Encounter: Payer: Self-pay | Admitting: Physical Therapy

## 2018-08-13 ENCOUNTER — Ambulatory Visit: Payer: Medicare Other | Admitting: Physical Therapy

## 2018-08-13 DIAGNOSIS — M545 Low back pain, unspecified: Secondary | ICD-10-CM

## 2018-08-13 DIAGNOSIS — G8929 Other chronic pain: Secondary | ICD-10-CM

## 2018-08-13 DIAGNOSIS — M542 Cervicalgia: Secondary | ICD-10-CM | POA: Diagnosis not present

## 2018-08-13 DIAGNOSIS — M6281 Muscle weakness (generalized): Secondary | ICD-10-CM

## 2018-08-13 NOTE — Therapy (Signed)
[facility] [address] [place], [place], [address] Phone: [phone]   Fax:  [phone]  Physical Therapy Treatment  Patient Details  Name: Brenda Andrews MRN: [id number] Date of Birth: 1945-11-21 Referring Provider (PT): Kendall Flack, MD  Progress Note Reporting Period 07/02/18 to 08/13/18  See note below for Objective Data and Assessment of Progress/Goals.   See below.    Encounter Date: 08/13/2018  PT End of Session - 08/13/18 0807    Visit Number  10    Date for PT Re-Evaluation  08/27/18    Authorization Type  Medicare/Medicaid    Authorization Time Period  07/02/18-08/27/18    PT Start Time  0800    PT Stop Time  0841    PT Time Calculation (min)  41 min    Activity Tolerance  Patient tolerated treatment well    Behavior During Therapy  [facility] for tasks assessed/performed       Past Medical History:  Diagnosis Date  . Arthritis   . Chronic kidney disease 2002  . COLITIS 03/19/2008   Qualifier: Diagnosis of  By: Marland Mcalpine    . Diabetes mellitus 1982  . Hyperlipidemia 2013  . IBS (irritable bowel syndrome)     History reviewed. No pertinent surgical history.  There were no vitals filed for this visit.  Subjective Assessment - 08/13/18 0805    Subjective  Pt states she has little to no pain today. Has been feeling little to no pain for several days.    Patient is accompained by:  --   interpreter not present today, was ordered   Limitations  Sitting;Lifting;Walking;House hold activities    Diagnostic tests  xrays lumbar and cervical: multilevel degeneration and arthritis    Patient Stated Goals  tolerate activities longer before needing to lay down, feel more energy    Currently in Pain?  No/denies         [facility] PT Assessment - 08/13/18 0001      Assessment   Medical Diagnosis  M54.2 (ICD-10-CM) - Cervicalgia, M54.42 (ICD-10-CM) - Lumbago with sciatica, left  side    Referring Provider (PT)  Kendall Flack, MD    Next MD Visit  January    Prior Therapy  in [place] for LBP      ROM / Strength   AROM / PROM / Strength  Strength      Strength   Overall Strength Comments  4+/5 bil LEs, 4/5 bil UEs      Ambulation/Gait   Gait Comments  stairs with 1 UE railing x 12 steps achieved                   [facility] Adult PT Treatment/Exercise - 08/13/18 0001      Exercises   Exercises  Lumbar;Knee/Hip      Lumbar Exercises: Stretches   Active Hamstring Stretch  Right;Left;30 seconds    Single Knee to Chest Stretch  Right;Left;10 seconds;3 reps    Lower Trunk Rotation  10 seconds;3 reps    Other Lumbar Stretch Exercise  Seated trunk rotations x 5 Rt/Lt, seated SB stretch 1x30 sec Rt/Lt    Other Lumbar Stretch Exercise  hip adductor stretch with strap 1x30 sec each      Lumbar Exercises: Aerobic   Nustep  L3 x 10 min      Lumbar Exercises: Standing   Functional Squats  15 reps    Row  Strengthening;15 reps;Both;Theraband  Theraband Level (Row)  Level 2 (Red)    Row Limitations  PT cued Pt to not lean trunk backwards    Other Standing Lumbar Exercises  4 stair flight with single UE rail for support x 3 round    Other Standing Lumbar Exercises  shoulder flexion red tband x 15 reps      Lumbar Exercises: Seated   Long Arc Quad on Chair  Strengthening;2 sets;15 reps;Weights    LAQ on Chair Weights (lbs)  2.5   Pt reported easy with 2.5#, PT trialed 10 reps with 5#     Lumbar Exercises: Supine   Bridge  15 reps               PT Short Term Goals - 07/30/18 0855      PT SHORT TERM GOAL #3   Title  Pt will receive at least a 4-/5 in bil UEs and LEs so that she will be able to perform more daily tasks with less pain.    Time  5    Period  Weeks    Status  Partially Met   Met for LE today       PT Long Term Goals - 08/13/18 0810      PT LONG TERM GOAL #1   Title  Pt will be ind in advanced HEP    Time  8     Period  Weeks    Status  On-going      PT LONG TERM GOAL #2   Title  Pt will receive at least a 4/5 for bil UE and LE strength tests to support greater activity tolerance.    Time  8    Period  Weeks    Status  Achieved      PT LONG TERM GOAL #3   Title  Pt will be able to perform at least 30 min of active daily tasks before needing a break to increase household and community activity participation.    Time  8    Period  Weeks    Status  Achieved   1 hour     PT LONG TERM GOAL #4   Title  Pt will report at least 50% improvement in neck and back pain.    Time  8    Period  Weeks    Status  Achieved      PT LONG TERM GOAL #5   Title  Pt will be able to perform 10 step ups and 10 step downs with single UE support safely to simulate her flight of stairs at home.    Time  8    Period  Weeks    Status  On-going            Plan - 08/13/18 8333    Clinical Impression Statement  Pt reports improved pain to little to no pain for several days.  Pt reports she is now able to perform household activities x 1 hour before needing a short break.  Break is more due to being tired vs having pain.  HEP going well. Pt bought 2.5# ankle weights and brought to PT today to ensure proper use.  Pt able to perform 2x15 with 2.5# bil with ease, so PT encouraged her to use both on one leg at home to use 5#.  Pt working toward achieving remaining LTGs including stairs with single UE rail.  PT added hip adductor stretch to HEP due to Pt report of pain along medial  thighs.  Also added UE tband exercises in standing for balance challenge and UE strength.  Interpreter arrived late today so FOTO to be done next visit.      Rehab Potential  Good    Clinical Impairments Affecting Rehab Potential  complex medical history, past Rt shoulder ORIF with limited ROM and strength, Pt reports malaise/fatigue, limited endurance    PT Frequency  2x / week    PT Duration  8 weeks    PT Treatment/Interventions  ADLs/Self  Care Home Management;Cryotherapy;Electrical Stimulation;Moist Heat;Traction;Stair training;Gait training;Functional mobility training;Therapeutic activities;Therapeutic exercise;Balance training;Neuromuscular re-education;Patient/family education;Manual techniques;Passive range of motion;Dry needling;Taping;Spinal Manipulations;Joint Manipulations    PT Next Visit Plan  FOTO next visit, continue UE tband exercises and progress lumbar stab and LE strength    PT Home Exercise Plan  [id number]    Consulted and Agree with Plan of Care  Patient       Patient will benefit from skilled therapeutic intervention in order to improve the following deficits and impairments:  Dizziness, Improper body mechanics, Pain, Decreased mobility, Postural dysfunction, Decreased activity tolerance, Decreased endurance, Decreased range of motion, Decreased strength, Hypomobility, Impaired UE functional use, Decreased balance, Impaired flexibility  Visit Diagnosis: Chronic midline low back pain, unspecified whether sciatica present  Muscle weakness (generalized)  Cervicalgia     Problem List Patient Active Problem List   Diagnosis Date Noted  . Diabetic polyneuropathy associated with type 2 diabetes mellitus ([place]) 07/17/2017  . Osteopenia 06/29/2016  . Encounter for Medicare annual wellness exam 06/25/2016  . Abdominal pain, LUQ 06/25/2016  . Ingrowing toenail 01/04/2016  . CKD (chronic kidney disease) stage 3, GFR 30-59 ml/min (HCC) 12/06/2015  . 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 Varicose veins of both lower extremities with complications 28/31/5176  . Seasonal allergies 05/30/2015  . Poor vision 03/10/2015  . Onychomycosis 12/14/2014  . Vitamin D deficiency 10/17/2014  . Orthostatic dizziness 10/15/2014  . Right hand pain 10/15/2014  . Numbness of fingers of both hands 10/15/2014  . Depression 09/07/2014  . Diabetic retinopathy ([place]) 11/24/2013  . DM (diabetes mellitus), type  2, uncontrolled ([place]) 03/19/2008  . Anemia 03/19/2008  . ARTHRITIS 03/19/2008  . OSTEOPOROSIS 03/19/2008   Baruch Merl, PT 08/13/18 8:45 AM   [facility] [address], [place], [place], [address] Phone: [phone]   Fax:  [phone]  Name: Brenda Andrews MRN: [id number] Date of Birth: 06/03/1946

## 2018-08-15 ENCOUNTER — Ambulatory Visit: Payer: Medicare Other | Admitting: Physical Therapy

## 2018-08-15 ENCOUNTER — Encounter: Payer: Self-pay | Admitting: Physical Therapy

## 2018-08-15 DIAGNOSIS — M6281 Muscle weakness (generalized): Secondary | ICD-10-CM

## 2018-08-15 DIAGNOSIS — M545 Low back pain: Principal | ICD-10-CM

## 2018-08-15 DIAGNOSIS — M542 Cervicalgia: Secondary | ICD-10-CM

## 2018-08-15 DIAGNOSIS — G8929 Other chronic pain: Secondary | ICD-10-CM

## 2018-08-15 NOTE — Therapy (Signed)
[facility] [address], [place], [place], [address] Phone: [phone]   Fax:  [phone]  Physical Therapy Treatment  Patient Details  Name: Brenda Andrews MRN: [id number] Date of Birth: 06/12/1946 Referring Provider (PT): Kendall Flack, MD   Encounter Date: 08/15/2018  PT End of Session - 08/15/18 0815    Visit Number  11    Date for PT Re-Evaluation  08/27/18    Authorization Type  Medicare/Medicaid    Authorization Time Period  07/02/18-08/27/18    PT Start Time  0812   Pt 12 min late   PT Stop Time  0845    PT Time Calculation (min)  33 min    Activity Tolerance  Patient tolerated treatment well    Behavior During Therapy  [facility] for tasks assessed/performed       Past Medical History:  Diagnosis Date  . Arthritis   . Chronic kidney disease 2002  . COLITIS 03/19/2008   Qualifier: Diagnosis of  By: Marland Mcalpine    . Diabetes mellitus 1982  . Hyperlipidemia 2013  . IBS (irritable bowel syndrome)     History reviewed. No pertinent surgical history.  There were no vitals filed for this visit.  Subjective Assessment - 08/15/18 0816    Subjective  Running late today. No pain.    Currently in Pain?  No/denies    Multiple Pain Sites  No         [facility] PT Assessment - 08/15/18 0001      Observation/Other Assessments   Focus on Therapeutic Outcomes (FOTO)   37% limited                   [facility] Adult PT Treatment/Exercise - 08/15/18 0001      Lumbar Exercises: Aerobic   Nustep  L3 x 10 min   FOTO concurrent     Lumbar Exercises: Seated   Long Arc Quad on Chair  Strengthening;Both;3 sets;10 reps;Weights    LAQ on Chair Weights (lbs)  2.5   HAd pt practice donning/doffing wts. Had a little difficulty   Other Seated Lumbar Exercises  marching2.5# 20x Bil      Knee/Hip Exercises: Standing   Hip Abduction  Stengthening;Both;1 set;20 reps;Knee straight    Abduction  Limitations  2.5 #    Stairs  5x 1 rail                PT Short Term Goals - 07/30/18 0855      PT SHORT TERM GOAL #3   Title  Pt will receive at least a 4-/5 in bil UEs and LEs so that she will be able to perform more daily tasks with less pain.    Time  5    Period  Weeks    Status  Partially Met   Met for LE today       PT Long Term Goals - 08/15/18 0845      PT LONG TERM GOAL #5   Title  Pt will be able to perform 10 step ups and 10 step downs with single UE support safely to simulate her flight of stairs at home.    Time  8    Period  Weeks    Status  Achieved            Plan - 08/15/18 8325    Clinical Impression Statement  Pt reduced her FOTO score significantly, she feels 60%-70% better. She reports not having  much pain but if anything she will be tired during the day. She knows how to pace herself. We spent time giving pt time to practice donning and doffing her ankle weights. It doe stake her time and she did better propping her foot on her other leg. She did say her friend can help her in the evenings. pt was able to go up and down stairs 4x with 1 rail today.     PT Next Visit Plan  2 more visits    PT Home Exercise Plan  [id number]       Patient will benefit from skilled therapeutic intervention in order to improve the following deficits and impairments:  Dizziness, Improper body mechanics, Pain, Decreased mobility, Postural dysfunction, Decreased activity tolerance, Decreased endurance, Decreased range of motion, Decreased strength, Hypomobility, Impaired UE functional use, Decreased balance, Impaired flexibility  Visit Diagnosis: Chronic midline low back pain, unspecified whether sciatica present  Muscle weakness (generalized)  Cervicalgia     Problem List Patient Active Problem List   Diagnosis Date Noted  . Diabetic polyneuropathy associated with type 2 diabetes mellitus ([place]) 07/17/2017  . Osteopenia 06/29/2016  . Encounter for Medicare  annual wellness exam 06/25/2016  . Abdominal pain, LUQ 06/25/2016  . Ingrowing toenail 01/04/2016  . CKD (chronic kidney disease) stage 3, GFR 30-59 ml/min (HCC) 12/06/2015  . 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 Varicose veins of both lower extremities with complications 21/97/5883  . Seasonal allergies 05/30/2015  . Poor vision 03/10/2015  . Onychomycosis 12/14/2014  . Vitamin D deficiency 10/17/2014  . Orthostatic dizziness 10/15/2014  . Right hand pain 10/15/2014  . Numbness of fingers of both hands 10/15/2014  . Depression 09/07/2014  . Diabetic retinopathy ([place]) 11/24/2013  . DM (diabetes mellitus), type 2, uncontrolled ([place]) 03/19/2008  . Anemia 03/19/2008  . ARTHRITIS 03/19/2008  . OSTEOPOROSIS 03/19/2008    Teal Raben, PTA 08/15/2018, 8:47 AM  [facility] [address], [place], [place], [address] Phone: [phone]   Fax:  [phone]  Name: Avagrace Botelho MRN: [id number] Date of Birth: 1946-01-11

## 2018-08-18 ENCOUNTER — Encounter: Payer: Self-pay | Admitting: Physical Therapy

## 2018-08-18 ENCOUNTER — Ambulatory Visit: Payer: Medicare Other | Admitting: Physical Therapy

## 2018-08-18 DIAGNOSIS — M542 Cervicalgia: Secondary | ICD-10-CM

## 2018-08-18 DIAGNOSIS — M545 Low back pain, unspecified: Secondary | ICD-10-CM

## 2018-08-18 DIAGNOSIS — G8929 Other chronic pain: Secondary | ICD-10-CM

## 2018-08-18 DIAGNOSIS — M6281 Muscle weakness (generalized): Secondary | ICD-10-CM

## 2018-08-18 NOTE — Therapy (Signed)
[facility] [address] [place], [place], [address] Phone: [phone]   Fax:  [phone]  Physical Therapy Treatment  Patient Details  Name: Brenda Andrews MRN: [id number] Date of Birth: [birth year] Referring Provider (PT): Kendall Flack, MD   Encounter Date: 08/18/2018  PT End of Session - 08/18/18 0806    Visit Number  12    Date for PT Re-Evaluation  08/27/18    Authorization Type  Medicare/Medicaid    Authorization Time Period  07/02/18-08/27/18    PT Start Time  0803    PT Stop Time  0841    PT Time Calculation (min)  38 min    Activity Tolerance  Patient tolerated treatment well    Behavior During Therapy  [facility] for tasks assessed/performed       Past Medical History:  Diagnosis Date  . Arthritis   . Chronic kidney disease 2002  . COLITIS 03/19/2008   Qualifier: Diagnosis of  By: Marland Mcalpine    . Diabetes mellitus 1982  . Hyperlipidemia 2013  . IBS (irritable bowel syndrome)     History reviewed. No pertinent surgical history.  There were no vitals filed for this visit.  Subjective Assessment - 08/18/18 0807    Subjective  No pain this AM    Currently in Pain?  No/denies    Multiple Pain Sites  No                       [facility] Adult PT Treatment/Exercise - 08/18/18 0001      Lumbar Exercises: Stretches   Active Hamstring Stretch  Right;Left;3 reps;20 seconds    Piriformis Stretch  Right;Left;3 reps;20 seconds    Gastroc Stretch  Right;Left;3 reps;20 seconds   On slant board, TC for position of her body     Lumbar Exercises: Aerobic   Nustep  L3 x 10 min   MHP to lumbar per pt request/enjoyment     Lumbar Exercises: Seated   Long Arc Quad on Chair  Strengthening;Both;2 sets;20 reps;Weights    LAQ on Chair Weights (lbs)  3    Other Seated Lumbar Exercises  marching2.5# 15x Bil      Knee/Hip Exercises: Standing   Hip Abduction  Stengthening;Both;1  set;20 reps;Knee straight    Abduction Limitations  2.5#    Stairs  5x 1 rail                PT Short Term Goals - 07/30/18 0855      PT SHORT TERM GOAL #3   Title  Pt will receive at least a 4-/5 in bil UEs and LEs so that she will be able to perform more daily tasks with less pain.    Time  5    Period  Weeks    Status  Partially Met   Met for LE today       PT Long Term Goals - 08/15/18 0845      PT LONG TERM GOAL #5   Title  Pt will be able to perform 10 step ups and 10 step downs with single UE support safely to simulate her flight of stairs at home.    Time  8    Period  Weeks    Status  Achieved            Plan - 08/18/18 0806    Clinical Impression Statement  Pt has no complaints coming into her PT sesison today.  She reports compliance with her HEP and using her weights when she can. She asks for helpe to get the weights on at home. She can confidently go up and down her stairs with one hand rail . She is ready for discharge on her next visit.     Rehab Potential  Good    Clinical Impairments Affecting Rehab Potential  complex medical history, past Rt shoulder ORIF with limited ROM and strength, Pt reports malaise/fatigue, limited endurance    PT Frequency  2x / week    PT Duration  8 weeks    PT Treatment/Interventions  ADLs/Self Care Home Management;Cryotherapy;Electrical Stimulation;Moist Heat;Traction;Stair training;Gait training;Functional mobility training;Therapeutic activities;Therapeutic exercise;Balance training;Neuromuscular re-education;Patient/family education;Manual techniques;Passive range of motion;Dry needling;Taping;Spinal Manipulations;Joint Manipulations    PT Next Visit Plan  DC next visit, MMt for goals.     PT Home Exercise Plan  [id number]    Consulted and Agree with Plan of Care  Patient       Patient will benefit from skilled therapeutic intervention in order to improve the following deficits and impairments:  Dizziness, Improper  body mechanics, Pain, Decreased mobility, Postural dysfunction, Decreased activity tolerance, Decreased endurance, Decreased range of motion, Decreased strength, Hypomobility, Impaired UE functional use, Decreased balance, Impaired flexibility  Visit Diagnosis: Chronic midline low back pain, unspecified whether sciatica present  Muscle weakness (generalized)  Cervicalgia     Problem List Patient Active Problem List   Diagnosis Date Noted  . Diabetic polyneuropathy associated with type 2 diabetes mellitus (HCC) 07/17/2017  . Osteopenia 06/29/2016  . Encounter for Medicare annual wellness exam 06/25/2016  . Abdominal pain, LUQ 06/25/2016  . Ingrowing toenail 01/04/2016  . CKD (chronic kidney disease) stage 3, GFR 30-59 ml/min (HCC) 12/06/2015  . 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 Varicose veins of both lower extremities with complications 05/30/2015  . Seasonal allergies 05/30/2015  . Poor vision 03/10/2015  . Onychomycosis 12/14/2014  . Vitamin D deficiency 10/17/2014  . Orthostatic dizziness 10/15/2014  . Right hand pain 10/15/2014  . Numbness of fingers of both hands 10/15/2014  . Depression 09/07/2014  . Diabetic retinopathy (HCC) 11/24/2013  . DM (diabetes mellitus), type 2, uncontrolled (HCC) 03/19/2008  . Anemia 03/19/2008  . ARTHRITIS 03/19/2008  . OSTEOPOROSIS 03/19/2008    COCHRAN,JENNIFER, PTA 08/18/2018, 8:35 AM  [facility] [address] [place], , [address] Phone: [phone]   Fax:  [phone]  Name: Brenda Andrews MRN: [id number] Date of Birth: 06/26/1946   

## 2018-08-22 ENCOUNTER — Ambulatory Visit: Payer: Medicare Other | Admitting: Physical Therapy

## 2018-08-22 ENCOUNTER — Encounter: Payer: Self-pay | Admitting: Physical Therapy

## 2018-08-22 DIAGNOSIS — M542 Cervicalgia: Secondary | ICD-10-CM | POA: Diagnosis not present

## 2018-08-22 DIAGNOSIS — M6281 Muscle weakness (generalized): Secondary | ICD-10-CM

## 2018-08-22 DIAGNOSIS — M545 Low back pain, unspecified: Secondary | ICD-10-CM

## 2018-08-22 DIAGNOSIS — G8929 Other chronic pain: Secondary | ICD-10-CM

## 2018-08-22 NOTE — Therapy (Signed)
[facility] [address], [place], [place], [address] Phone: [phone]   Fax:  [phone]  Physical Therapy Treatment  Patient Details  Name: Brenda Andrews MRN: [id number] Date of Birth: 1946-06-17 Referring Provider (PT): Kendall Flack, MD   Encounter Date: 08/22/2018  PT End of Session - 08/22/18 0810    Visit Number  13    Date for PT Re-Evaluation  08/27/18    Authorization Type  Medicare/Medicaid    Authorization Time Period  07/02/18-08/27/18    PT Start Time  0803    PT Stop Time  0842    PT Time Calculation (min)  39 min    Activity Tolerance  Patient tolerated treatment well    Behavior During Therapy  [facility] for tasks assessed/performed       Past Medical History:  Diagnosis Date  . Arthritis   . Chronic kidney disease 2002  . COLITIS 03/19/2008   Qualifier: Diagnosis of  By: Marland Mcalpine    . Diabetes mellitus 1982  . Hyperlipidemia 2013  . IBS (irritable bowel syndrome)     History reviewed. No pertinent surgical history.  There were no vitals filed for this visit.  Subjective Assessment - 08/22/18 0809    Subjective  No pain and hasn't had pain in many days.  Feels stronger and has more energy to do active work throughout the day. Can be active for over an hour before needing a break.     Patient is accompained by:  Interpreter    Limitations  Sitting;Lifting;Walking;House hold activities    Diagnostic tests  xrays lumbar and cervical: multilevel degeneration and arthritis    Patient Stated Goals  tolerate activities longer before needing to lay down, feel more energy    Currently in Pain?  No/denies                       [facility] Adult PT Treatment/Exercise - 08/22/18 0001      Exercises   Exercises  Lumbar;Knee/Hip      Lumbar Exercises: Stretches   Active Hamstring Stretch  Right;Left;1 rep;30 seconds    Lower Trunk Rotation  10 seconds;3 reps    bil   Other Lumbar Stretch Exercise  seated trunk rotation 2x10 sec bil      Lumbar Exercises: Aerobic   Nustep  NuStep L3 x 8'    PT present to review goals     Lumbar Exercises: Standing   Other Standing Lumbar Exercises  4 stair flight, 6 inches, single UE handhold, 5 sets      Lumbar Exercises: Seated   Long Arc Quad on Chair  Strengthening;Both;2 sets;15 reps;Weights    LAQ on Chair Weights (lbs)  3    Other Seated Lumbar Exercises  seated marching bil x 20 3# ankle weights x 2 sets   PT cued core engagement    Other Seated Lumbar Exercises  yellow tband row x 15 reps      Lumbar Exercises: Supine   Clam  15 reps   2 sets   Clam Limitations  green band    Bridge  15 reps   2 sets     Knee/Hip Exercises: Stretches   Gastroc Stretch  Both;30 seconds   slant board     Knee/Hip Exercises: Standing   Hip Abduction  Stengthening;Both;10 reps;Knee straight    Abduction Limitations  2.5    Hip Extension  Stengthening;10 reps;Knee bent  Extension Limitations  2.5               PT Short Term Goals - 08/22/18 9518      PT SHORT TERM GOAL #1   Title  Pt will be ind in initial HEP targeting flexibility, ROM, UE/LE strengthening, and cervical and lumbar stabilization    Time  4    Period  Weeks    Status  Achieved      PT SHORT TERM GOAL #2   Title  Pt will be able to perform active daily tasks such as cooking or cleaning > or = 15 min before needing a break demonstrating greater endurance.    Time  5    Period  Weeks    Status  Achieved      PT SHORT TERM GOAL #3   Title  Pt will receive at least a 4-/5 in bil UEs and LEs so that she will be able to perform more daily tasks with less pain.    Time  5    Period  Weeks    Status  Achieved        PT Long Term Goals - 08/22/18 8416      PT LONG TERM GOAL #1   Title  Pt will be ind in advanced HEP    Time  8    Period  Weeks    Status  Achieved      PT LONG TERM GOAL #2   Title  Pt will receive at  least a 4/5 for bil UE and LE strength tests to support greater activity tolerance.    Time  8    Period  Weeks    Status  Achieved      PT LONG TERM GOAL #3   Title  Pt will be able to perform at least 30 min of active daily tasks before needing a break to increase household and community activity participation.    Time  8    Period  Weeks    Status  Achieved      PT LONG TERM GOAL #4   Title  Pt will report at least 50% improvement in neck and back pain.    Time  8    Period  Weeks    Status  Achieved      PT LONG TERM GOAL #5   Title  Pt will be able to perform 10 step ups and 10 step downs with single UE support safely to simulate her flight of stairs at home.    Time  8    Period  Weeks    Status  Achieved            Plan - 08/22/18 0815    Clinical Impression Statement  Pt has reached the end of her cert period having met all goals.  She is able to tolerate daily activities for over an hour at a time without a need for a break and is more limited at that point by fatigue than pain.  She states her fatigue is quick to recover and she is able to be active again.  She has made gains in bil UEs and LEs and can perform functional tasks such as stairs without difficulty.  She is ready to D/C to HEP with return to clinic as needed for any exacerbation of pain.    Rehab Potential  Good    Clinical Impairments Affecting Rehab Potential  complex medical history, past Rt shoulder ORIF  with limited ROM and strength, Pt reports malaise/fatigue, limited endurance    PT Frequency  2x / week    PT Duration  8 weeks    PT Treatment/Interventions  ADLs/Self Care Home Management;Cryotherapy;Electrical Stimulation;Moist Heat;Traction;Stair training;Gait training;Functional mobility training;Therapeutic activities;Therapeutic exercise;Balance training;Neuromuscular re-education;Patient/family education;Manual techniques;Passive range of motion;Dry needling;Taping;Spinal Manipulations;Joint  Manipulations    PT Next Visit Plan  d/c to [place] and Agree with Plan of Care  Patient       Patient will benefit from skilled therapeutic intervention in order to improve the following deficits and impairments:  Dizziness, Improper body mechanics, Pain, Decreased mobility, Postural dysfunction, Decreased activity tolerance, Decreased endurance, Decreased range of motion, Decreased strength, Hypomobility, Impaired UE functional use, Decreased balance, Impaired flexibility  Visit Diagnosis: Chronic midline low back pain, unspecified whether sciatica present  Muscle weakness (generalized)  Cervicalgia     Problem List Patient Active Problem List   Diagnosis Date Noted  . Diabetic polyneuropathy associated with type 2 diabetes mellitus ([place]) 07/17/2017  . Osteopenia 06/29/2016  . Encounter for Medicare annual wellness exam 06/25/2016  . Abdominal pain, LUQ 06/25/2016  . Ingrowing toenail 01/04/2016  . CKD (chronic kidney disease) stage 3, GFR 30-59 ml/min (HCC) 12/06/2015  . 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 Varicose veins of both lower extremities with complications 00/34/9179  . Seasonal allergies 05/30/2015  . Poor vision 03/10/2015  . Onychomycosis 12/14/2014  . Vitamin D deficiency 10/17/2014  . Orthostatic dizziness 10/15/2014  . Right hand pain 10/15/2014  . Numbness of fingers of both hands 10/15/2014  . Depression 09/07/2014  . Diabetic retinopathy ([place]) 11/24/2013  . DM (diabetes mellitus), type 2, uncontrolled ([place]) 03/19/2008  . Anemia 03/19/2008  . ARTHRITIS 03/19/2008  . OSTEOPOROSIS 03/19/2008    PHYSICAL THERAPY DISCHARGE SUMMARY  Visits from Start of Care: 13  Current functional level related to goals / functional outcomes: See above   Remaining deficits: See above   Education / Equipment: HEP Plan: Patient agrees to discharge.  Patient goals were met. Patient is being  discharged due to meeting the stated rehab goals.  ?????         Baruch Merl, PT 08/22/18 8:41 AM   [facility] [address] [place], [place], [address] Phone: [phone]   Fax:  [phone]  Name: Brenda Andrews MRN: [id number] Date of Birth: 1946/03/15

## 2019-05-22 ENCOUNTER — Encounter: Payer: Self-pay | Admitting: Internal Medicine

## 2019-06-15 ENCOUNTER — Other Ambulatory Visit: Payer: Self-pay | Admitting: Family Medicine

## 2019-06-15 DIAGNOSIS — Z1231 Encounter for screening mammogram for malignant neoplasm of breast: Secondary | ICD-10-CM

## 2019-08-03 ENCOUNTER — Ambulatory Visit: Payer: Medicare Other

## 2019-08-11 ENCOUNTER — Encounter: Payer: Self-pay | Admitting: Physician Assistant

## 2019-08-31 ENCOUNTER — Encounter: Payer: Self-pay | Admitting: Physician Assistant

## 2019-08-31 ENCOUNTER — Ambulatory Visit (INDEPENDENT_AMBULATORY_CARE_PROVIDER_SITE_OTHER): Payer: Medicare Other | Admitting: Physician Assistant

## 2019-08-31 VITALS — BP 110/50 | HR 88 | Temp 98.5°F | Ht 60.0 in | Wt 162.1 lb

## 2019-08-31 DIAGNOSIS — R140 Abdominal distension (gaseous): Secondary | ICD-10-CM

## 2019-08-31 DIAGNOSIS — K5900 Constipation, unspecified: Secondary | ICD-10-CM

## 2019-08-31 DIAGNOSIS — R1012 Left upper quadrant pain: Secondary | ICD-10-CM

## 2019-08-31 DIAGNOSIS — Z1159 Encounter for screening for other viral diseases: Secondary | ICD-10-CM

## 2019-08-31 NOTE — Progress Notes (Signed)
Chief Complaint: Left upper quadrant pain, bloating  HPI:    Brenda Andrews is a 73 year old female from [place], who presents to clinic accompanied by an interpreter, known to Dr. Carlean Purl, who was referred to me by Kendall Flack, MD for a complaint of left upper quadrant pain.      03/19/2008 office visit with Dr. Katha Cabal and she discussed constipation abdominal pain.  At that time discussed being constipated and bloated ever since she had a Salmonella infection.  A colonoscopy was ordered.    03/25/2008 colonoscopy with mild a sending diverticulosis and otherwise normal.  Patient started on MiraLAX once daily and Align once a day.    08/03/2019 labs with a hemoglobin A1c of 8.  TSH elevated at 6.077.  Repeat 08/27/2019 with elevation at 5.968.    Today, patient explains that for the past 3 years she has felt constantly bloated and constipated with abdominal pain mainly in her left upper quadrant.  She has been boiling plums and drinking the water as well as trying to adjust her diet to help with constipation, when she does have a good bowel movement it does help with her left upper quadrant pain.  Along with this bloating feeling also describes passing a lot of gas.  Typically only has 1 small bowel movement every few days.  Also describes having a positive blood test from her stool in [place] a year ago.  Can sometimes go 4 days without a bowel movement.  Has tried MiraLAX in the past which does work for her but she was worried that she would "get addicted".    Denies heartburn, reflux, nausea, vomiting, epigastric pain, weight loss or symptoms that awaken her from sleep.  Past Medical History:  Diagnosis Date  . Arthritis   . Chronic kidney disease 2002  . COLITIS 03/19/2008   Qualifier: Diagnosis of  By: Marland Mcalpine    . Diabetes mellitus 1982  . Hyperlipidemia 2013  . IBS (irritable bowel syndrome)     No past surgical history on file.  Current Outpatient Medications    Medication Sig Dispense Refill  . ACCU-CHEK AVIVA PLUS test strip USE AS DIRECTED TO TEST BLOOD SUGAR THREE TIMES DAILY 100 each 5  . ACCU-CHEK FASTCLIX LANCETS MISC USE 3 TIMES PER DAY BEFORE MEALS AND EVERY NIGHT AT BEDTIME 102 each 5  . acyclovir (ZOVIRAX) 400 MG tablet Take 1 tablet (400 mg total) by mouth 3 (three) times daily. (Patient not taking: Reported on 10/16/2017) 45 tablet 5  . atorvastatin (LIPITOR) 40 MG tablet TAKE 1 TABLET BY MOUTH DAILY AT 6 PM 90 tablet 0  . Blood Glucose Monitoring Suppl (ACCU-CHEK AVIVA PLUS) w/Device KIT Use as directed - E11.65 1 kit 0  . cholecalciferol (VITAMIN D) 1000 units tablet Take 1,000 Units by mouth 2 (two) times daily.    . diclofenac sodium (VOLTAREN) 1 % GEL Apply 2 g topically 4 (four) times daily. (Patient not taking: Reported on 12/18/2017) 100 g 1  . gabapentin (NEURONTIN) 300 MG capsule Take 1 capsule (300 mg total) by mouth at bedtime. 90 capsule 0  . insulin aspart (NOVOLOG FLEXPEN) 100 UNIT/ML FlexPen Inject 10 Units into the skin 3 (three) times daily with meals. 15 mL 11  . Insulin Glargine (BASAGLAR KWIKPEN) 100 UNIT/ML SOPN Inject 0.4 mLs (40 Units total) into the skin daily at 10 pm. 15 mL 12  . Insulin Pen Needle (B-D ULTRAFINE III SHORT PEN) 31G X 8 MM MISC USE AS  DIRECTED FOUR TIMES DAILY 200 each 2  . Multiple Vitamins-Minerals (CENTRUM SILVER 50+WOMEN PO) Take by mouth.     No current facility-administered medications for this visit.    Allergies as of 08/31/2019 - Review Complete 08/22/2018  Allergen Reaction Noted  . Pollen extract-tree extract  11/27/2011  . Sulfonamide derivatives  03/19/2008    Family History  Problem Relation Age of Onset  . Diabetes Brother   . Diabetes Brother   . Diabetes Brother   . Diabetes Brother     Social History   Socioeconomic History  . Marital status: Widowed    Spouse name: Not on file  . Number of children: Not on file  . Years of education: Not on file  . Highest  education level: Not on file  Occupational History  . Not on file  Tobacco Use  . Smoking status: Never Smoker  . Smokeless tobacco: Never Used  Substance and Sexual Activity  . Alcohol use: No  . Drug use: No  . Sexual activity: Not Currently    Birth control/protection: None  Other Topics Concern  . Not on file  Social History Narrative  . Not on file   Social Determinants of Health   Financial Resource Strain:   . Difficulty of Paying Living Expenses: Not on file  Food Insecurity:   . Worried About Charity fundraiser in the Last Year: Not on file  . Ran Out of Food in the Last Year: Not on file  Transportation Needs:   . Lack of Transportation (Medical): Not on file  . Lack of Transportation (Non-Medical): Not on file  Physical Activity:   . Days of Exercise per Week: Not on file  . Minutes of Exercise per Session: Not on file  Stress:   . Feeling of Stress : Not on file  Social Connections:   . Frequency of Communication with Friends and Family: Not on file  . Frequency of Social Gatherings with Friends and Family: Not on file  . Attends Religious Services: Not on file  . Active Member of Clubs or Organizations: Not on file  . Attends Archivist Meetings: Not on file  . Marital Status: Not on file  Intimate Partner Violence:   . Fear of Current or Ex-Partner: Not on file  . Emotionally Abused: Not on file  . Physically Abused: Not on file  . Sexually Abused: Not on file    Review of Systems:    Constitutional: No weight loss, fever or chills Skin: No rash  Cardiovascular: No chest pain Respiratory: No SOB Gastrointestinal: See HPI and otherwise negative Genitourinary: No dysuria  Neurological: No headache, dizziness or syncope Musculoskeletal: No new muscle or joint pain Hematologic: No bleeding  Psychiatric: No history of depression or anxiety   Physical Exam:  Vital signs: BP (!) 110/50 (BP Location: Left Arm, Patient Position: Sitting, Cuff  Size: Normal)   Pulse 88   Temp 98.5 F (36.9 C)   Ht 5' (1.524 m) Comment: height measured without shoes  Wt 162 lb 2 oz (73.5 kg)   BMI 31.66 kg/m   Constitutional:   [place] female appears to be in NAD, Well developed, Well nourished, alert and cooperative Head:  Normocephalic and atraumatic. Eyes:   PEERL, EOMI. No icterus. Conjunctiva pink. Ears:  Normal auditory acuity. Neck:  Supple Throat: Oral cavity and pharynx without inflammation, swelling or lesion.  Respiratory: Respirations even and unlabored. Lungs clear to auscultation bilaterally.   No wheezes,  crackles, or rhonchi.  Cardiovascular: Normal S1, S2. No MRG. Regular rate and rhythm. No peripheral edema, cyanosis or pallor.  Gastrointestinal:  Soft, nondistended, moderate LUQ ttp, mild LLQ ttp. No rebound or guarding. Normal bowel sounds. No appreciable masses or hepatomegaly. Rectal:  Not performed.  Msk:  Symmetrical without gross deformities. Without edema, no deformity or joint abnormality.  Neurologic:  Alert and  oriented x4;  grossly normal neurologically.  Skin:   Dry and intact without significant lesions or rashes. Psychiatric: Demonstrates good judgement and reason without abnormal affect or behaviors.  See HPI for recent labs.  Assessment: 1.  Constipation: Chronic for the patient, apparent during last office visit in 2009, continues with bloating in the left upper quadrant pain better with a bowel movement; most likely still irritable bowel 2.  Bloating 3.  Left upper quadrant pain: Likely with constipation above  Plan: 1.  Scheduled patient for diagnostic colonoscopy given abdominal pain and constipation, it has also been over 10 years since her last one.  Scheduled this with Dr. Carlean Purl [facility]  Did discuss risks, benefits, limitations and alternatives and the patient agrees to proceed. 2.  Would recommend the patient start using her MiraLAX once daily in the morning, discussed that she can  titrate this up to 4 times a day if needed.  Recommend she try to achieve one soft solid bowel movement per day. 3.  Patient did question whether she needed an endoscopy, I do not think she is having any upper symptoms at this time, likely everything is related to constipation, would suggest that we proceed with colonoscopy first and see how she does. 4.  Patient was advised that she will be Covid tested 2 days prior to time of procedure. 5.  Patient to follow in clinic per recommendations from Dr. Carlean Purl after time of procedure.  Brenda Newer, PA-C [facility] 08/31/2019, 10:58 AM  Cc: Kendall Flack, MD

## 2019-08-31 NOTE — Patient Instructions (Addendum)
If you are age 75 or older, your body mass index should be between 23-30. Your There is no height or weight on file to calculate BMI. If this is out of the aforementioned range listed, please consider follow up with your Primary Care Provider.  If you are age 71 or younger, your body mass index should be between 19-25. Your There is no height or weight on file to calculate BMI. If this is out of the aformentioned range listed, please consider follow up with your Primary Care Provider.   Please start taking Miralax daily. This is over the counter and can be purchased at any pharmacy.  You have been scheduled for a colonoscopy. Please follow written instructions given to you at your visit today.  Please pick up your prep supplies at the pharmacy within the next 1-3 days. If you use inhalers (even only as needed), please bring them with you on the day of your procedure.  Due to recent changes in healthcare laws, you may see the results of your imaging and laboratory studies on MyChart before your provider has had a chance to review them.  We understand that in some cases there may be results that are confusing or concerning to you. Not all laboratory results come back in the same time frame and the provider may be waiting for multiple results in order to interpret others.  Please give [place] 48 hours in order for your provider to thoroughly review all the results before contacting the office for clarification of your results.

## 2019-09-11 ENCOUNTER — Ambulatory Visit: Payer: Medicare Other

## 2019-09-28 ENCOUNTER — Other Ambulatory Visit: Payer: Self-pay | Admitting: Internal Medicine

## 2019-09-28 ENCOUNTER — Ambulatory Visit (INDEPENDENT_AMBULATORY_CARE_PROVIDER_SITE_OTHER): Payer: Self-pay

## 2019-09-28 DIAGNOSIS — Z1159 Encounter for screening for other viral diseases: Secondary | ICD-10-CM

## 2019-09-30 ENCOUNTER — Encounter: Payer: Self-pay | Admitting: Internal Medicine

## 2019-09-30 ENCOUNTER — Ambulatory Visit (AMBULATORY_SURGERY_CENTER): Payer: Medicare Other | Admitting: Internal Medicine

## 2019-09-30 ENCOUNTER — Other Ambulatory Visit: Payer: Self-pay

## 2019-09-30 VITALS — BP 115/49 | HR 69 | Temp 97.7°F | Resp 14 | Ht 60.0 in | Wt 162.0 lb

## 2019-09-30 DIAGNOSIS — K5730 Diverticulosis of large intestine without perforation or abscess without bleeding: Secondary | ICD-10-CM

## 2019-09-30 DIAGNOSIS — K648 Other hemorrhoids: Secondary | ICD-10-CM

## 2019-09-30 DIAGNOSIS — K5900 Constipation, unspecified: Secondary | ICD-10-CM

## 2019-09-30 DIAGNOSIS — D124 Benign neoplasm of descending colon: Secondary | ICD-10-CM

## 2019-09-30 DIAGNOSIS — R1012 Left upper quadrant pain: Secondary | ICD-10-CM

## 2019-09-30 MED ORDER — SODIUM CHLORIDE 0.9 % IV SOLN: 500.0000 mL | INTRAVENOUS | Status: DC

## 2019-09-30 NOTE — Patient Instructions (Addendum)
Tome Mira[employer]ax al menos una dosis [employer] Es seguro tomarlo y no se Patent examiner.  Sin cancer  Un polipo. Parece benigno. Sera revisado y lo notificare.  Diverticulosis  Gatha Mayer, MD, FACG   YOU HAD AN ENDOSCOPIC PROCEDURE TODAY AT [facility]:   Refer to the procedure report that was given to you for any specific questions about what was found during the examination.  If the procedure report does not answer your questions, please call your gastroenterologist to clarify.  If you requested that your care partner not be given the details of your procedure findings, then the procedure report has been included in a sealed envelope for you to review at your convenience later.  YOU SHOULD EXPECT: Some feelings of bloating in the abdomen. Passage of more gas than usual.  Walking can help get rid of the air that was put into your GI tract during the procedure and reduce the bloating. If you had a lower endoscopy (such as a colonoscopy or flexible sigmoidoscopy) you may notice spotting of blood in your stool or on the toilet paper. If you underwent a bowel prep for your procedure, you may not have a normal bowel movement for a few days.  Please Note:  You might notice some irritation and congestion in your nose or some drainage.  This is from the oxygen used during your procedure.  There is no need for concern and it should clear up in a day or so.  SYMPTOMS TO REPORT IMMEDIATELY:   Following lower endoscopy (colonoscopy or flexible sigmoidoscopy):  Excessive amounts of blood in the stool  Significant tenderness or worsening of abdominal pains  Swelling of the abdomen that is new, acute  Fever of 100F or higher   For urgent or emergent issues, a gastroenterologist can be reached at any hour by calling [phone].   DIET:  We do recommend a small meal at first, but then you may proceed to your regular diet.  Drink plenty of fluids but you should avoid  alcoholic beverages for 24 hours.  ACTIVITY:  You should plan to take it easy for the rest of today and you should NOT DRIVE or use heavy machinery until tomorrow (because of the sedation medicines used during the test).    FOLLOW UP: Our staff will call the number listed on your records 48-72 hours following your procedure to check on you and address any questions or concerns that you may have regarding the information given to you following your procedure. If we do not reach you, we will leave a message.  We will attempt to reach you two times.  During this call, we will ask if you have developed any symptoms of COVID 19. If you develop any symptoms (ie: fever, flu-like symptoms, shortness of breath, cough etc.) before then, please call [phone].  If you test positive for Covid 19 in the 2 weeks post procedure, please call and report this information to [place].    If any biopsies were taken you will be contacted by phone or by letter within the next 1-3 weeks.  Please call us at [phone] if you have not heard about the biopsies in 3 weeks.    SIGNATURES/CONFIDENTIALITY: You and/or your care partner have signed paperwork which will be entered into your electronic medical record.  These signatures attest to the fact that that the information above on your After Visit Summary has been reviewed and is understood.  Full responsibility  of the confidentiality of this discharge information lies with you and/or your care-partner. Hemorroides Hemorrhoids Las hemorroides son venas inflamadas que pueden desarrollarse:  En el ano (recto). Estas se denominan hemorroides internas.  Alrededor de la abertura del ano. Estas se denominan hemorroides externas. Las hemorroides pueden causar dolor, picazn o hemorragias. Generalmente no causan problemas graves. Con frecuencia mejoran al [employer] dieta, el estilo de vida y otros tratamientos Financial planner. Cules son las causas? Esta afeccin puede ser  causada por lo siguiente:  Tener dificultad para defecar (estreimiento).  Hacer mucha fuerza (esfuerzo) para defecar.  Materia fecal lquida (diarrea).  Embarazo.  Tener mucho sobrepeso (obesidad).  Estar sentado durante largos perodos de [place].  Levantar objetos pesados u otras actividades que impliquen esfuerzo.  Sexo anal.  Andar en bicicleta por un largo perodo de tiempo. Cules son los signos o los sntomas? Los sntomas de esta afeccin incluyen los siguientes:  Social research officer, government.  Picazn o irritacin en el ano.  Sangrado proveniente del ano.  Prdida de materia fecal.  Inflamacin en la zona.  Uno o ms bultos alrededor de la abertura del ano. Cmo se diagnostica? A menudo un mdico puede diagnosticar esta afeccin al observar la zona afectada. El mdico tambin puede:  Optometrist un examen que implica palpar la zona con la mano enguantada (examen rectal digital).  Examinar el interior de la zona anal utilizando un pequeo tubo (anoscopio).  Pedir anlisis de [place]. Es posible que esto se realice si ha perdido Optometrist.  Solicitarle un estudio que consiste en la observacin del interior del colon utilizando un tubo flexible con una cmara en el extremo (sigmoidoscopia o colonoscopa). Cmo se trata? Esta afeccin generalmente se puede tratar en el hogar. El mdico puede indicarle que cambie de Designer, multimedia, de estilo de vida o que trate de Physicist, medical. Si esto no da resultado, se pueden realizar procedimientos para extirpar las hemorroides o reducir Publishing rights manager. Estos pueden implicar lo siguiente:  Building services engineer en la base de las hemorroides para interrumpir la irrigacin de Herbalist.  Inyectar un medicamento en las hemorroides para reducir Publishing rights manager.  Dirigir un tipo de Teacher, early years/pre de luz hacia las hemorroides para Field seismologist que se caigan.  Realizar Ardelia Mems ciruga para extirpar las hemorroides o cortar la irrigacin de [place]. Siga  estas indicaciones en su casa: Comida y bebida   Consuma alimentos con alto contenido de [place]. Entre ellos cereales integrales, frijoles, frutos secos, frutas y verduras.  Pregntele a su mdico acerca de tomar productos con fibra aadida en ellos (complementos defibra).  Disminuya la cantidad de grasa de la dieta. Para esto, puede hacer lo siguiente: ? Coma productos lcteos descremados. ? Coma menos carne roja. ? No consuma alimentos procesados.  Beba suficiente lquido para Consulting civil engineer orina de color amarillo plido. Control del dolor y [place] un bao de agua tibia (bao de asiento) durante 20 minutos para Best boy. Hgalo 3 o 4veces al da. Puede hacer esto en una baera o usar un dispositivo porttil para bao de asiento que se coloca sobre el inodoro.  Si se lo indican, aplique hielo sobre la zona dolorida. Puede ser beneficioso aplicar hielo [employer] baos con agua tibia. ? Ponga el hielo en una bolsa plstica. ? Coloque una [employer] piel y Therapist, nutritional. ? Coloque el hielo durante 32minutos, 2 a 3veces por da. Indicaciones generales  [employer] de venta libre y los recetados solamente  como se lo haya indicado el mdico. ? Las [employer] y los medicamentos pueden usarse como se lo hayan indicado.  Haga ejercicio fsico con frecuencia. Consulte al mdico qu tipos de ejercicios son mejores para usted y qu cantidad.  Vaya al bao cuando sienta ganas de defecar. No espere.  Evite hacer demasiada fuerza al defecar.  Mantenga el ano seco y limpio. Use papel higinico hmedo o toallitas humedecidas despus de defecar.  No pase mucho tiempo sentado en el inodoro.  Concurra a todas las visitas de seguimiento como se lo haya indicado el mdico. Esto es importante. Comunquese con un mdico si:  Tiene dolor e hinchazn que no mejoran con el tratamiento o los medicamentos.  Tiene problemas para defecar.  No puede  defecar.  Tiene dolor o hinchazn en la zona exterior de las hemorroides. Solicite ayuda inmediatamente si tiene:  Hemorragia que no se detiene. Resumen  Las hemorroides son venas hinchadas en el ano o la zona que rodea el ano.  Pueden causar dolor, picazn o sangrado.  Consuma alimentos con alto contenido de [place]. Entre ellos cereales integrales, frijoles, frutos secos, frutas y verduras.  Tome un bao de agua tibia (bao de asiento) durante 20 minutos para Best boy. Hgalo 3 o 4veces al da. Esta informacin no tiene Marine scientist el consejo del mdico. Asegrese de hacerle al mdico cualquier pregunta que tenga. Document Revised: 02/20/2018 Document Reviewed: 02/20/2018 Elsevier Patient Education  [place].

## 2019-09-30 NOTE — Op Note (Addendum)
[place] Patient Name: Brenda Andrews Procedure Date: 09/30/2019 8:05 AM MRN: [id number] Endoscopist: Gatha Mayer , MD Age: 74 Referring MD:  Date of Birth: May 12, 1946 Gender: Female Account #: [id number] Procedure:                Colonoscopy Indications:              Abdominal pain in the left upper quadrant,                            Worsening constipation Medicines:                Propofol per Anesthesia, Monitored Anesthesia Care Procedure:                Pre-Anesthesia Assessment:                           - Prior to the procedure, a History and Physical                            was performed, and patient medications and                            allergies were reviewed. The patient's tolerance of                            previous anesthesia was also reviewed. The risks                            and benefits of the procedure and the sedation                            options and risks were discussed with the patient.                            All questions were answered, and informed consent                            was obtained. Prior Anticoagulants: The patient has                            taken no previous anticoagulant or antiplatelet                            agents. ASA Grade Assessment: III - A patient with                            severe systemic disease. After reviewing the risks                            and benefits, the patient was deemed in                            satisfactory condition to undergo the procedure.  After obtaining informed consent, the colonoscope                            was passed under direct vision. Throughout the                            procedure, the patient's blood pressure, pulse, and                            oxygen saturations were monitored continuously. The                            Colonoscope was introduced through the anus and   advanced to the the cecum, identified by                            appendiceal orifice and ileocecal valve. The                            colonoscopy was performed with moderate difficulty                            due to a redundant colon and significant looping.                            Successful completion of the procedure was aided by                            applying abdominal pressure. The quality of the                            bowel preparation was excellent. The bowel                            preparation used was Miralax via split dose                            instruction. The ileocecal valve, appendiceal                            orifice, and rectum were photographed. The patient                            tolerated the procedure well. Scope In: 8:09:45 AM Scope Out: 8:36:28 AM Scope Withdrawal Time: 0 hours 11 minutes 38 seconds  Total Procedure Duration: 0 hours 26 minutes 43 seconds  Findings:                 The perianal and digital rectal examinations were                            normal.                           A diminutive polyp was found in the descending  colon. The polyp was sessile. The polyp was removed                            with a cold snare. Resection and retrieval were                            complete. Verification of patient identification                            for the specimen was done. Estimated blood loss was                            minimal.                           Multiple small-mouthed diverticula were found in                            the sigmoid colon, descending colon, transverse                            colon and ascending colon.                           The exam was otherwise without abnormality on                            direct and retroflexion views. Complications:            No immediate complications. Estimated Blood Loss:     Estimated blood loss was minimal. Impression:                - One diminutive polyp in the descending colon,                            removed with a cold snare. Resected and retrieved.                           - Diverticulosis in the sigmoid colon, in the                            descending colon, in the transverse colon and in                            the ascending colon.                           - The examination was otherwise normal on direct                            and retroflexion views. ALSO HAD SWOLLEN                            HEMORRHOIDS WHICH LIKELY CAUSED BLEEDING Recommendation:           - Patient has a contact  number available for                            emergencies. The signs and symptoms of potential                            delayed complications were discussed with the                            patient. Return to normal activities tomorrow.                            Written discharge instructions were provided to the                            patient.                           - Resume previous diet.                           - Continue present medications.                           - TAKE MIRALAX EVERY DAY                           - No repeat colonoscopy due to age. Gatha Mayer, MD 09/30/2019 8:50:48 AM This report has been signed electronically.

## 2019-09-30 NOTE — Progress Notes (Signed)
To PACU, VSS. Report to [email or web address] 

## 2019-09-30 NOTE — Progress Notes (Signed)
Temperature- June Bullock VS- [facility]  Interpreter used today at the [employer] for this pt.  Interpreter's name is- [employer]

## 2019-09-30 NOTE — Progress Notes (Signed)
Called to room to assist during endoscopic procedure.  Patient ID and intended procedure confirmed with present staff. Received instructions for my participation in the procedure from the performing physician.  

## 2019-10-02 ENCOUNTER — Telehealth: Payer: Self-pay

## 2019-10-02 NOTE — Telephone Encounter (Signed)
  Follow up Call-  Call back number 09/30/2019  Post procedure Call Back phone  # [phone]  Permission to leave phone message Yes  Some recent data might be hidden     Patient questions:  Do you have a fever, pain , or abdominal swelling? No. Pain Score  0 *  Have you tolerated food without any problems? Yes.    Have you been able to return to your normal activities? Yes.    Do you have any questions about your discharge instructions: Diet   No. Medications  No. Follow up visit  No.  Do you have questions or concerns about your Care? No.  Actions: * If pain score is 4 or above: No action needed, pain <4.  1. Have you developed a fever since your procedure? no  2.   Have you had an respiratory symptoms (SOB or cough) since your procedure? no  3.   Have you tested positive for COVID 19 since your procedure no  4.   Have you had any family members/close contacts diagnosed with the COVID 19 since your procedure?  no   If yes to any of these questions please route to Joylene John, RN and Alphonsa Gin, Therapist, sports.

## 2019-10-05 ENCOUNTER — Encounter: Payer: Self-pay | Admitting: Internal Medicine

## 2019-10-08 LAB — SARS CORONAVIRUS 2 (TAT 6-24 HRS): SARS Coronavirus 2: NEGATIVE

## 2020-08-11 ENCOUNTER — Encounter: Payer: Self-pay | Admitting: Internal Medicine

## 2020-09-15 ENCOUNTER — Ambulatory Visit (INDEPENDENT_AMBULATORY_CARE_PROVIDER_SITE_OTHER): Payer: Medicare Other | Admitting: Podiatry

## 2020-09-15 ENCOUNTER — Other Ambulatory Visit: Payer: Self-pay

## 2020-09-15 DIAGNOSIS — B351 Tinea unguium: Secondary | ICD-10-CM | POA: Diagnosis not present

## 2020-09-15 DIAGNOSIS — E1165 Type 2 diabetes mellitus with hyperglycemia: Secondary | ICD-10-CM

## 2020-09-15 DIAGNOSIS — E1142 Type 2 diabetes mellitus with diabetic polyneuropathy: Secondary | ICD-10-CM

## 2020-09-15 MED ORDER — CICLOPIROX 8 % EX SOLN: Freq: Every day | CUTANEOUS | 0 refills | Status: AC

## 2020-09-15 NOTE — Patient Instructions (Addendum)

## 2020-09-18 ENCOUNTER — Encounter: Payer: Self-pay | Admitting: Podiatry

## 2020-09-18 NOTE — Progress Notes (Signed)
  Subjective:  Patient ID: Brenda Andrews, female    DOB: 1946-06-25,  MRN: [id number]  Chief Complaint  Patient presents with  . Nail Problem    Pt complaints of nail tenderness, possible ingrown- has had b/l ingrown removed before- further eval- pt is diabetic     75 y.o. female presents with the above complaint. History confirmed with patient.  She is Spanish-speaking and translation is provided by my medical assistant Lind Guest  Objective:  Physical Exam: warm, good capillary refill, no trophic changes or ulcerative lesions and normal DP and PT pulses.  Abnormal sensory exam with loss of protective sensation in the toes.  She has onychomycosis with discoloration of the bilateral hallux nail.  Causing pain.  Remaining hallux nails are elongated.   Assessment:   1. Onychomycosis   2. Diabetic polyneuropathy associated with type 2 diabetes mellitus ([place])   3. Uncontrolled type 2 diabetes mellitus with hyperglycemia ([place])      Plan:  Patient was evaluated and treated and all questions answered.  Patient educated on diabetes. Discussed proper diabetic foot care and discussed risks and complications of disease. Educated patient in depth on reasons to return to the office immediately should he/she discover anything concerning or new on the feet. All questions answered. Discussed proper shoes as well.   Discussed with her the pain she is having is likely secondary to diabetic polyneuropathy she should discuss this with her primary care doctor for medical management.  Discussed the etiology and treatment options for the condition in detail with the patient. Educated patient on the topical and oral treatment options for mycotic nails. Recommended debridement of the nails today. Sharp and mechanical debridement performed of all painful and mycotic nails today. Nails debrided in length and thickness using a nail nipper to level of comfort. Discussed treatment options  including appropriate shoe gear. Follow up as needed for painful nails.     Return in about 3 months (around 12/14/2020).

## 2020-12-14 ENCOUNTER — Ambulatory Visit: Payer: Medicare Other | Admitting: Podiatry

## 2021-01-25 ENCOUNTER — Encounter: Payer: Self-pay | Admitting: Podiatry

## 2021-01-25 ENCOUNTER — Ambulatory Visit (INDEPENDENT_AMBULATORY_CARE_PROVIDER_SITE_OTHER): Payer: Medicare Other | Admitting: Podiatry

## 2021-01-25 ENCOUNTER — Other Ambulatory Visit: Payer: Self-pay

## 2021-01-25 DIAGNOSIS — M79674 Pain in right toe(s): Secondary | ICD-10-CM

## 2021-01-25 DIAGNOSIS — E1142 Type 2 diabetes mellitus with diabetic polyneuropathy: Secondary | ICD-10-CM | POA: Diagnosis not present

## 2021-01-25 DIAGNOSIS — M79675 Pain in left toe(s): Secondary | ICD-10-CM

## 2021-01-25 DIAGNOSIS — N183 Chronic kidney disease, stage 3 unspecified: Secondary | ICD-10-CM

## 2021-01-25 DIAGNOSIS — B351 Tinea unguium: Secondary | ICD-10-CM

## 2021-01-25 NOTE — Progress Notes (Signed)
This patient returns to my office for at risk foot care.  This patient requires this care by a professional since this patient will be at risk due to having diabetes mellitus and CKD.   This patient is unable to cut nails herself since the patient cannot reach her nails.These nails are painful walking and wearing shoes.  This patient presents for at risk foot care today.  General Appearance  Alert, conversant and in no acute stress.  Vascular  Dorsalis pedis and posterior tibial  pulses are palpable  bilaterally.  Capillary return is within normal limits  bilaterally. Temperature is within normal limits  bilaterally.  Neurologic  Senn-Weinstein monofilament wire test diminished  bilaterally. Muscle power within normal limits bilaterally.  Nails Thick disfigured discolored nails with subungual debris  from hallux to fifth toes bilaterally. No evidence of bacterial infection or drainage bilaterally.  Orthopedic  No limitations of motion  feet .  No crepitus or effusions noted.  No bony pathology or digital deformities noted.  Skin  normotropic skin with no porokeratosis noted bilaterally.  No signs of infections or ulcers noted.     Onychomycosis  Pain in right toes  Pain in left toes  Consent was obtained for treatment procedures.   Mechanical debridement of nails 1-5  bilaterally performed with a nail nipper.  Filed with dremel without incident.    Return office visit  3 months                    Told patient to return for periodic foot care and evaluation due to potential at risk complications.   Gardiner Barefoot DPM

## 2021-05-31 ENCOUNTER — Ambulatory Visit: Payer: Medicare Other | Admitting: Podiatry

## 2022-08-16 ENCOUNTER — Other Ambulatory Visit: Payer: Self-pay | Admitting: Orthopedic Surgery

## 2022-08-16 DIAGNOSIS — M5416 Radiculopathy, lumbar region: Secondary | ICD-10-CM

## 2022-08-16 DIAGNOSIS — M48062 Spinal stenosis, lumbar region with neurogenic claudication: Secondary | ICD-10-CM

## 2022-09-05 ENCOUNTER — Ambulatory Visit
Admission: RE | Admit: 2022-09-05 | Discharge: 2022-09-05 | Disposition: A | Discharge status: Home / Self Care | Payer: Medicare Other | Source: Ambulatory Visit | Attending: Orthopedic Surgery | Admitting: Orthopedic Surgery

## 2022-09-05 DIAGNOSIS — M5416 Radiculopathy, lumbar region: Secondary | ICD-10-CM

## 2022-09-05 DIAGNOSIS — M48062 Spinal stenosis, lumbar region with neurogenic claudication: Secondary | ICD-10-CM
